# Patient Record
Sex: Female | Born: 1985 | Race: Black or African American | Hispanic: No | Marital: Single | Smoking: Never smoker
Health system: Southern US, Community
[De-identification: ages and names within clinical notes are randomized; demographics above are authoritative.]

## PROBLEM LIST (undated history)

## (undated) DIAGNOSIS — T4145XA Adverse effect of unspecified anesthetic, initial encounter: Secondary | ICD-10-CM

## (undated) DIAGNOSIS — T8859XA Other complications of anesthesia, initial encounter: Secondary | ICD-10-CM

## (undated) DIAGNOSIS — R011 Cardiac murmur, unspecified: Secondary | ICD-10-CM

## (undated) DIAGNOSIS — J40 Bronchitis, not specified as acute or chronic: Secondary | ICD-10-CM

## (undated) HISTORY — DX: Cardiac murmur, unspecified: R01.1

## (undated) HISTORY — DX: Bronchitis, not specified as acute or chronic: J40

---

## 2017-10-20 ENCOUNTER — Other Ambulatory Visit: Payer: Self-pay

## 2017-10-20 ENCOUNTER — Ambulatory Visit: Payer: BLUE CROSS/BLUE SHIELD | Admitting: Obstetrics and Gynecology

## 2017-10-20 ENCOUNTER — Other Ambulatory Visit (HOSPITAL_COMMUNITY)
Admission: RE | Admit: 2017-10-20 | Discharge: 2017-10-20 | Disposition: A | Discharge status: Home / Self Care | Payer: BLUE CROSS/BLUE SHIELD | Source: Ambulatory Visit | Attending: Obstetrics and Gynecology | Admitting: Obstetrics and Gynecology

## 2017-10-20 ENCOUNTER — Encounter: Payer: Self-pay | Admitting: Obstetrics and Gynecology

## 2017-10-20 VITALS — BP 158/80 | HR 84 | Resp 16 | Ht 59.75 in | Wt 273.0 lb

## 2017-10-20 DIAGNOSIS — Z23 Encounter for immunization: Secondary | ICD-10-CM

## 2017-10-20 DIAGNOSIS — Z6841 Body Mass Index (BMI) 40.0 and over, adult: Secondary | ICD-10-CM

## 2017-10-20 DIAGNOSIS — Z3009 Encounter for other general counseling and advice on contraception: Secondary | ICD-10-CM

## 2017-10-20 DIAGNOSIS — N913 Primary oligomenorrhea: Secondary | ICD-10-CM | POA: Diagnosis not present

## 2017-10-20 DIAGNOSIS — Z113 Encounter for screening for infections with a predominantly sexual mode of transmission: Secondary | ICD-10-CM | POA: Insufficient documentation

## 2017-10-20 DIAGNOSIS — Z124 Encounter for screening for malignant neoplasm of cervix: Secondary | ICD-10-CM | POA: Diagnosis not present

## 2017-10-20 DIAGNOSIS — R011 Cardiac murmur, unspecified: Secondary | ICD-10-CM | POA: Diagnosis not present

## 2017-10-20 DIAGNOSIS — Z7189 Other specified counseling: Secondary | ICD-10-CM

## 2017-10-20 DIAGNOSIS — Z7185 Encounter for immunization safety counseling: Secondary | ICD-10-CM

## 2017-10-20 DIAGNOSIS — Z01419 Encounter for gynecological examination (general) (routine) without abnormal findings: Secondary | ICD-10-CM

## 2017-10-20 DIAGNOSIS — Z0000 Encounter for general adult medical examination without abnormal findings: Secondary | ICD-10-CM

## 2017-10-20 DIAGNOSIS — R030 Elevated blood-pressure reading, without diagnosis of hypertension: Secondary | ICD-10-CM | POA: Diagnosis not present

## 2017-10-20 LAB — POCT URINE PREGNANCY: Preg Test, Ur: NEGATIVE

## 2017-10-20 NOTE — Patient Instructions (Addendum)
I would recommend a mediterranean diet.  A mediterranean diet is high in fruits, vegetables, whole grains, fish, chicken, nuts, healthy fats (olive oil or canola oil). Low fat dairy. Limit butter, margarine, red meat and sweets.    Breast Self-Awareness Breast self-awareness means being familiar with how your breasts look and feel. It involves checking your breasts regularly and reporting any changes to your health care provider. Practicing breast self-awareness is important. A change in your breasts can be a sign of a serious medical problem. Being familiar with how your breasts look and feel allows you to find any problems early, when treatment is more likely to be successful. All women should practice breast self-awareness, including women who have had breast implants. How to do a breast self-exam One way to learn what is normal for your breasts and whether your breasts are changing is to do a breast self-exam. To do a breast self-exam: Look for Changes  1. Remove all the clothing above your waist. 2. Stand in front of a mirror in a room with good lighting. 3. Put your hands on your hips. 4. Push your hands firmly downward. 5. Compare your breasts in the mirror. Look for differences between them (asymmetry), such as: ? Differences in shape. ? Differences in size. ? Puckers, dips, and bumps in one breast and not the other. 6. Look at each breast for changes in your skin, such as: ? Redness. ? Scaly areas. 7. Look for changes in your nipples, such as: ? Discharge. ? Bleeding. ? Dimpling. ? Redness. ? A change in position. Feel for Changes  Carefully feel your breasts for lumps and changes. It is best to do this while lying on your back on the floor and again while sitting or standing in the shower or tub with soapy water on your skin. Feel each breast in the following way:  Place the arm on the side of the breast you are examining above your head.  Feel your breast with the other  hand.  Start in the nipple area and make  inch (2 cm) overlapping circles to feel your breast. Use the pads of your three middle fingers to do this. Apply light pressure, then medium pressure, then firm pressure. The light pressure will allow you to feel the tissue closest to the skin. The medium pressure will allow you to feel the tissue that is a little deeper. The firm pressure will allow you to feel the tissue close to the ribs.  Continue the overlapping circles, moving downward over the breast until you feel your ribs below your breast.  Move one finger-width toward the center of the body. Continue to use the  inch (2 cm) overlapping circles to feel your breast as you move slowly up toward your collarbone.  Continue the up and down exam using all three pressures until you reach your armpit.  Write Down What You Find  Write down what is normal for each breast and any changes that you find. Keep a written record with breast changes or normal findings for each breast. By writing this information down, you do not need to depend only on memory for size, tenderness, or location. Write down where you are in your menstrual cycle, if you are still menstruating. If you are having trouble noticing differences in your breasts, do not get discouraged. With time you will become more familiar with the variations in your breasts and more comfortable with the exam. How often should I examine my breasts? Examine  your breasts every month. If you are breastfeeding, the best time to examine your breasts is after a feeding or after using a breast pump. If you menstruate, the best time to examine your breasts is 5-7 days after your period is over. During your period, your breasts are lumpier, and it may be more difficult to notice changes. When should I see my health care provider? See your health care provider if you notice:  A change in shape or size of your breasts or nipples.  A change in the skin of your  breast or nipples, such as a reddened or scaly area.  Unusual discharge from your nipples.  A lump or thick area that was not there before.  Pain in your breasts.  Anything that concerns you.  This information is not intended to replace advice given to you by your health care provider. Make sure you discuss any questions you have with your health care provider. Document Released: 07/20/2005 Document Revised: 12/26/2015 Document Reviewed: 06/09/2015 Elsevier Interactive Patient Education  2018 [employer].  EXERCISE AND DIET:  We recommended that you start or continue a regular exercise program for good health. Regular exercise means any activity that makes your heart beat faster and makes you sweat.  We recommend exercising at least 30 minutes per day at least 3 days a week, preferably 4 or 5.  We also recommend a diet low in fat and sugar.  Inactivity, poor dietary choices and obesity can cause diabetes, heart attack, stroke, and kidney damage, among others.    ALCOHOL AND SMOKING:  Women should limit their alcohol intake to no more than 7 drinks/beers/glasses of wine (combined, not each!) per week. Moderation of alcohol intake to this level decreases your risk of breast cancer and liver damage. And of course, no recreational drugs are part of a healthy lifestyle.  And absolutely no smoking or even second hand smoke. Most people know smoking can cause heart and lung diseases, but did you know it also contributes to weakening of your bones? Aging of your skin?  Yellowing of your teeth and nails?  CALCIUM AND VITAMIN D:  Adequate intake of calcium and Vitamin D are recommended.  The recommendations for exact amounts of these supplements seem to change often, but generally speaking 600 mg of calcium (either carbonate or citrate) and 800 units of Vitamin D per day seems prudent. Certain women may benefit from higher intake of Vitamin D.  If you are among these women, your doctor will have told you  during your visit.    PAP SMEARS:  Pap smears, to check for cervical cancer or precancers,  have traditionally been done yearly, although recent scientific advances have shown that most women can have pap smears less often.  However, every woman still should have a physical exam from her gynecologist every year. It will include a breast check, inspection of the vulva and vagina to check for abnormal growths or skin changes, a visual exam of the cervix, and then an exam to evaluate the size and shape of the uterus and ovaries.  And after 30 years of age, a rectal exam is indicated to check for rectal cancers. We will also provide age appropriate advice regarding health maintenance, like when you should have certain vaccines, screening for sexually transmitted diseases, bone density testing, colonoscopy, mammograms, etc.   MAMMOGRAMS:  All women over 14 years old should have a yearly mammogram. Many facilities now offer a "3D" mammogram, which may cost around $50  extra out of pocket. If possible,  we recommend you accept the option to have the 3D mammogram performed.  It both reduces the number of women who will be called back for extra views which then turn out to be normal, and it is better than the routine mammogram at detecting truly abnormal areas.    COLONOSCOPY:  Colonoscopy to screen for colon cancer is recommended for all women at age 42.  We know, you hate the idea of the prep.  We agree, BUT, having colon cancer and not knowing it is worse!!  Colon cancer so often starts as a polyp that can be seen and removed at colonscopy, which can quite literally save your life!  And if your first colonoscopy is normal and you have no family history of colon cancer, most women don't have to have it again for 10 years.  Once every ten years, you can do something that may end up saving your life, right?  We will be happy to help you get it scheduled when you are ready.  Be sure to check your insurance coverage so  you understand how much it will cost.  It may be covered as a preventative service at no cost, but you should check your particular policy.

## 2017-10-20 NOTE — Progress Notes (Signed)
32 y.o. G0P0000 SingleAfrican AmericanF here for annual exam.   Period Duration (Days): 5-7 days  Period Pattern: (!) Irregular Menstrual Flow: Heavy Menstrual Control: Maxi pad Menstrual Control Change Freq (Hours): changes pad every 2 hours  Dysmenorrhea: None  Menarche around age 30-13. Cycles have always been irregular. In the past she has gone up to 6 months without cycles. In the last year she has had a cycle every 1-3 months. She doesn't bleed for more than 5-7 days.  Sexually active, same partner x 9/18. Using condoms for contraception. No pain. Partner may have other partners.  She thinks she has gained weight.  In the past she had elevated BP on OCP's, was fine off.   Patient's last menstrual period was 10/11/2017.       LMP 10/11/17   Sexually active: Yes.    The current method of family planning is none.    Exercising: Yes.    stretching  Smoker:  no  Health Maintenance: Pap:  Never TDaP:  Unsure,  Gardasil: unsure    reports that  has never smoked. she has never used smokeless tobacco. She reports that she does not drink alcohol or use drugs. Occasional ETOH. 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   History reviewed. No pertinent past medical history.  History reviewed. No pertinent surgical history.  Current Outpatient Medications  Medication Sig Dispense Refill  . PROAIR HFA 108 (90 Base) MCG/ACT inhaler INL 2 PFS PO Q 4 H PRF WHZ  0   No current facility-administered medications for this visit.   Only on the proair for bronchitis.  History reviewed. No pertinent family history.  Review of Systems  Constitutional: Negative.   HENT: Negative.   Eyes: Negative.   Respiratory: Negative.   Cardiovascular: Negative.   Gastrointestinal: Negative.   Endocrine: Negative.   Genitourinary: Positive for menstrual problem.       Irregular menstrual periods   Musculoskeletal: Negative.   Skin: Negative.   Allergic/Immunologic: Negative.   Neurological: Negative.    Psychiatric/Behavioral: Negative.     Exam:   BP (!) 160/100 (BP Location: Right Arm, Patient Position: Sitting, Cuff Size: Large)   Pulse 84   Resp 16   Ht 4' 11.75" (1.518 m)   Wt 273 lb (123.8 kg)   LMP 10/11/2017   BMI 53.76 kg/m   Weight change: @WEIGHTCHANGE @ Height:   Height: 4' 11.75" (151.8 cm)  Ht Readings from Last 3 Encounters:  10/20/17 4' 11.75" (1.518 m)    General appearance: alert, cooperative and appears stated age Head: Normocephalic, without obvious abnormality, atraumatic Neck: no adenopathy, supple, symmetrical, trachea midline and thyroid normal to inspection and palpation Lungs: clear to auscultation bilaterally Cardiovascular: regular rate and rhythm, grade 2 SEM, loudest at the LSB Breasts: normal appearance, no masses or tenderness Abdomen: soft, non-tender; non distended,  no masses,  no organomegaly Extremities: extremities normal, atraumatic, no cyanosis or edema Skin: Skin color, texture, turgor normal. No rashes or lesions Lymph nodes: Cervical, supraclavicular, and axillary nodes normal. No abnormal inguinal nodes palpated Neurologic: Grossly normal   Pelvic: External genitalia:  no lesions              Urethra:  normal appearing urethra with no masses, tenderness or lesions              Bartholins and Skenes: normal                 Vagina: normal appearing vagina with normal color and  discharge, no lesions              Cervix: no lesions               Bimanual Exam:  Uterus:  anteverted, not appreciably enlarged              Adnexa: no mass, fullness, tenderness               Rectovaginal: Confirms               Anus:  normal sphincter tone, no lesions  Chaperone was present for exam.  A:  Well Woman exam  Elevated BP  BMI 53  Oligomenorrhea  Contraception  Heart murmur, will set up appointment with Cardiology   P:   UPT negative  We discussed weight loss, information given  Screening labs, HgbA1C, TSH  TDAP  Start  Gardsil  Pap with GC/CT  STD testing  Recommended continued use of condoms  Information on the mirena given, after her labs are back will further discuss  Stressed the importance of endometrial protection  Repeat BP still elevated, will recommend f/u with primary MD

## 2017-10-21 LAB — COMPREHENSIVE METABOLIC PANEL
A/G RATIO: 1.4 (ref 1.2–2.2)
ALBUMIN: 4.3 g/dL (ref 3.5–5.5)
ALK PHOS: 84 IU/L (ref 39–117)
ALT: 17 IU/L (ref 0–32)
AST: 15 IU/L (ref 0–40)
BUN / CREAT RATIO: 25 — AB (ref 9–23)
BUN: 17 mg/dL (ref 6–20)
CHLORIDE: 104 mmol/L (ref 96–106)
CO2: 22 mmol/L (ref 20–29)
Calcium: 9.5 mg/dL (ref 8.7–10.2)
Creatinine, Ser: 0.67 mg/dL (ref 0.57–1.00)
GFR calc Af Amer: 137 mL/min/{1.73_m2} (ref >59)
GFR calc non Af Amer: 119 mL/min/{1.73_m2} (ref >59)
GLOBULIN, TOTAL: 3 g/dL (ref 1.5–4.5)
GLUCOSE: 88 mg/dL (ref 65–99)
POTASSIUM: 4.2 mmol/L (ref 3.5–5.2)
SODIUM: 141 mmol/L (ref 134–144)
Total Protein: 7.3 g/dL (ref 6.0–8.5)

## 2017-10-21 LAB — HEP, RPR, HIV PANEL
HEP B S AG: NEGATIVE
HIV SCREEN 4TH GENERATION: NONREACTIVE
RPR: NONREACTIVE

## 2017-10-21 LAB — LIPID PANEL
CHOL/HDL RATIO: 3.3 ratio (ref 0.0–4.4)
Cholesterol, Total: 154 mg/dL (ref 100–199)
HDL: 46 mg/dL (ref >39)
LDL Calculated: 96 mg/dL (ref 0–99)
Triglycerides: 58 mg/dL (ref 0–149)
VLDL Cholesterol Cal: 12 mg/dL (ref 5–40)

## 2017-10-21 LAB — CBC
HEMOGLOBIN: 12.4 g/dL (ref 11.1–15.9)
Hematocrit: 37.9 % (ref 34.0–46.6)
MCH: 27.4 pg (ref 26.6–33.0)
MCHC: 32.7 g/dL (ref 31.5–35.7)
MCV: 84 fL (ref 79–97)
Platelets: 278 10*3/uL (ref 150–379)
RBC: 4.53 x10E6/uL (ref 3.77–5.28)
RDW: 13.7 % (ref 12.3–15.4)
WBC: 6.6 10*3/uL (ref 3.4–10.8)

## 2017-10-21 LAB — TSH: TSH: 1.19 u[IU]/mL (ref 0.450–4.500)

## 2017-10-21 LAB — HEPATITIS C ANTIBODY: Hep C Virus Ab: <0.1 s/co ratio (ref 0.0–0.9)

## 2017-10-21 LAB — CYTOLOGY - PAP
Chlamydia: NEGATIVE
Diagnosis: NEGATIVE
Neisseria Gonorrhea: NEGATIVE

## 2017-10-21 LAB — HEMOGLOBIN A1C
Est. average glucose Bld gHb Est-mCnc: 120 mg/dL
HEMOGLOBIN A1C: 5.8 % — AB (ref 4.8–5.6)

## 2017-11-09 ENCOUNTER — Encounter: Payer: Self-pay | Admitting: Cardiovascular Disease

## 2017-11-25 ENCOUNTER — Encounter: Payer: Self-pay | Admitting: Physician Assistant

## 2017-12-08 ENCOUNTER — Ambulatory Visit: Payer: BLUE CROSS/BLUE SHIELD | Admitting: Cardiovascular Disease

## 2017-12-14 ENCOUNTER — Ambulatory Visit: Payer: BLUE CROSS/BLUE SHIELD | Admitting: Physician Assistant

## 2017-12-14 VITALS — BP 138/86 | HR 75 | Ht 59.75 in | Wt 275.0 lb

## 2017-12-14 DIAGNOSIS — R7303 Prediabetes: Secondary | ICD-10-CM

## 2017-12-14 DIAGNOSIS — R011 Cardiac murmur, unspecified: Secondary | ICD-10-CM | POA: Diagnosis not present

## 2017-12-14 NOTE — Progress Notes (Signed)
Cardiology Office Note:    Date:  12/14/2017   ID:  Erin Pena, DOB [birth year], MRN [id number]  PCP:  Helane Rima, DO  Cardiologist:  Will Jorja Loa, MD   Referring MD: Romualdo Bolk, MD   Chief Complaint  Patient presents with  . Heart Murmur    History of Present Illness:    Erin Pena is a 32 y.o. female with a hx of prediabetes (A1c 5.8%) and irregular menstrual cycles. She presents today for cardiology evaluation for heart murmur. She was recently seen by her gynecologist who noted a soft systolic murmur on her exam on 10/20/17 and referred her to cardiology.   She presents today for new patient visit for heart murmur. She denies chest pain, shortness of breath, orthopnea, nocturnal apnea, lower extremity swelling, dizziness,and recent syncope. She gets bronchitis a few times each year with cough and was prescribed an inhaler to use PRN. She is not particularly worried about this murmur.  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 Nonsmoker, no illicit drugs. Single, no children. Grew up in [place].     Past Medical History:  Diagnosis Date  . Bronchitis   . Heart murmur     History reviewed. No pertinent surgical history.  Current Medications: Current Meds  Medication Sig  . Loratadine (CLARITIN PO) Take 1 tablet by mouth as needed.  . Multiple Vitamin (MULTIVITAMIN) capsule Take 1 capsule by mouth daily.  Marland Kitchen PROAIR HFA 108 (90 Base) MCG/ACT inhaler INL 2 PFS PO Q 4 H PRF WHZ     Allergies:   Patient has no known allergies.   Social History   Socioeconomic History  . Marital status: Single    Spouse name: Not on file  . Number of children: Not on file  . Years of education: Not on file  . Highest education level: Not on file  Occupational History  . Not on file  Social Needs  . Financial resource strain: Not on file  . Food insecurity:    Worry: Not on file    Inability: Not on file  . Transportation needs:    Medical: Not on file   Non-medical: Not on file  Tobacco Use  . Smoking status: Never Smoker  . Smokeless tobacco: Never Used  Substance and Sexual Activity  . Alcohol use: No    Frequency: Never  . Drug use: No  . Sexual activity: Yes    Partners: Male    Birth control/protection: None  Lifestyle  . Physical activity:    Days per week: Not on file    Minutes per session: Not on file  . Stress: Not on file  Relationships  . Social connections:    Talks on phone: Not on file    Gets together: Not on file    Attends religious service: Not on file    Active member of club or organization: Not on file    Attends meetings of clubs or organizations: Not on file    Relationship status: Not on file  Other Topics Concern  . Not on file  Social History Narrative  . Not on file     Family History: The patient's family history includes Hypertension in her father.  ROS:   Please see the history of present illness.    All other systems reviewed and are negative.  EKGs/Labs/Other Studies Reviewed:    The following studies were reviewed today:  none  EKG:  EKG is ordered today.  The  ekg ordered today demonstrates sinus rhythm with nonspecific ST changes, no prior for comparison.  Recent Labs: 10/20/2017: ALT 17; BUN 17; Creatinine, Ser 0.67; Hemoglobin 12.4; Platelets 278; Potassium 4.2; Sodium 141; TSH 1.190  Recent Lipid Panel    Component Value Date/Time   CHOL 154 10/20/2017 1607   TRIG 58 10/20/2017 1607   HDL 46 10/20/2017 1607   CHOLHDL 3.3 10/20/2017 1607   LDLCALC 96 10/20/2017 1607    Physical Exam:    VS:  BP 138/86   Pulse 75   Ht 4' 11.75" (1.518 m)   Wt 275 lb (124.7 kg)   SpO2 99%   BMI 54.16 kg/m     Wt Readings from Last 3 Encounters:  12/14/17 275 lb (124.7 kg)  10/20/17 273 lb (123.8 kg)     GEN: Well nourished, well developed in no acute distress HEENT: Normal NECK: No JVD; No carotid bruits LYMPHATICS: No lymphadenopathy CARDIAC: RRR, 1/6 systolic murmur  LSB RESPIRATORY:  Clear to auscultation without rales, wheezing or rhonchi  ABDOMEN: Soft, non-tender, non-distended MUSCULOSKELETAL:  No edema; No deformity  SKIN: Warm and dry NEUROLOGIC:  Alert and oriented x 3 PSYCHIATRIC:  Normal affect   ASSESSMENT:    1. Heart murmur   2. Prediabetes    PLAN:    In order of problems listed above:  Heart murmur  Will order echocardiogram with divinity and bubble study to evaluate structure and function. EKG WNL. She is asymptomatic. Case discussed with Dr. Elberta Fortis (DOD).   Prediabetes We discussed her pressure of 138/86 and a daily walking program in addition to avoiding salt and fast food. I encouraged walking 30-45 min daily to help with weight, A1c, and pressure.    Normal blood pressure If he pressure becomes greater than 140/90, we will need to consider an antihypertensive. Hopefully this will decrease or remain stable with daily walking and avoiding salt.    Medication Adjustments/Labs and Tests Ordered: Current medicines are reviewed at length with the patient today.  Concerns regarding medicines are outlined above.  Orders Placed This Encounter  Procedures  . EKG 12-Lead  . ECHOCARDIOGRAM LIMITED BUBBLE STUDY   No orders of the defined types were placed in this encounter.   Signed, Marcelino Duster, [place]  12/14/2017 9:57 AM    [facility]

## 2017-12-14 NOTE — Patient Instructions (Signed)
Medication Instructions:  1. Your physician recommends that you continue on your current medications as directed. Please refer to the Current Medication list given to you today.   Labwork: NONE ORDERED TODAY  Testing/Procedures: PLEASE SCHEDULE ECHOCARDIOGRAM (BUBBLE STUDY)  Follow-Up: FOLLOW UP AS NEEDED PENDING TEST RESULTS  Any Other Special Instructions Will Be Listed Below (If Applicable). RECOMMENDATIONS PER PA TRY TO INCORPORATE 30-45 MINUTES OF WALKING DAILY TO HELP YOUR A1C AND BLOOD PRESSURE       If you need a refill on your cardiac medications before your next appointment, please call your pharmacy.

## 2017-12-18 ENCOUNTER — Other Ambulatory Visit: Payer: Self-pay

## 2017-12-18 ENCOUNTER — Emergency Department (HOSPITAL_COMMUNITY): Payer: BLUE CROSS/BLUE SHIELD

## 2017-12-18 ENCOUNTER — Encounter (HOSPITAL_COMMUNITY): Payer: Self-pay | Admitting: Emergency Medicine

## 2017-12-18 ENCOUNTER — Emergency Department (HOSPITAL_COMMUNITY)
Admission: EM | Admit: 2017-12-18 | Discharge: 2017-12-18 | Disposition: A | Discharge status: Home / Self Care | Payer: BLUE CROSS/BLUE SHIELD | Attending: Emergency Medicine | Admitting: Emergency Medicine

## 2017-12-18 DIAGNOSIS — R1011 Right upper quadrant pain: Secondary | ICD-10-CM

## 2017-12-18 DIAGNOSIS — Z79899 Other long term (current) drug therapy: Secondary | ICD-10-CM | POA: Diagnosis not present

## 2017-12-18 DIAGNOSIS — K8050 Calculus of bile duct without cholangitis or cholecystitis without obstruction: Secondary | ICD-10-CM

## 2017-12-18 DIAGNOSIS — K8020 Calculus of gallbladder without cholecystitis without obstruction: Secondary | ICD-10-CM | POA: Insufficient documentation

## 2017-12-18 LAB — URINALYSIS, ROUTINE W REFLEX MICROSCOPIC
BILIRUBIN URINE: NEGATIVE
Glucose, UA: NEGATIVE mg/dL
HGB URINE DIPSTICK: NEGATIVE
KETONES UR: NEGATIVE mg/dL
Leukocytes, UA: NEGATIVE
NITRITE: NEGATIVE
PH: 6 (ref 5.0–8.0)
Protein, ur: NEGATIVE mg/dL
SPECIFIC GRAVITY, URINE: 1.026 (ref 1.005–1.030)

## 2017-12-18 LAB — LIPASE, BLOOD: LIPASE: 29 U/L (ref 11–51)

## 2017-12-18 LAB — CBC
HEMATOCRIT: 45.2 % (ref 36.0–46.0)
Hemoglobin: 14.1 g/dL (ref 12.0–15.0)
MCH: 27.4 pg (ref 26.0–34.0)
MCHC: 31.2 g/dL (ref 30.0–36.0)
MCV: 87.9 fL (ref 78.0–100.0)
PLATELETS: 251 10*3/uL (ref 150–400)
RBC: 5.14 MIL/uL — AB (ref 3.87–5.11)
RDW: 13.6 % (ref 11.5–15.5)
WBC: 8 10*3/uL (ref 4.0–10.5)

## 2017-12-18 LAB — COMPREHENSIVE METABOLIC PANEL
ALT: 22 U/L (ref 14–54)
AST: 18 U/L (ref 15–41)
Albumin: 3.7 g/dL (ref 3.5–5.0)
Alkaline Phosphatase: 80 U/L (ref 38–126)
Anion gap: 10 (ref 5–15)
BILIRUBIN TOTAL: 0.5 mg/dL (ref 0.3–1.2)
BUN: 19 mg/dL (ref 6–20)
CO2: 26 mmol/L (ref 22–32)
CREATININE: 0.89 mg/dL (ref 0.44–1.00)
Calcium: 9.3 mg/dL (ref 8.9–10.3)
Chloride: 105 mmol/L (ref 101–111)
Glucose, Bld: 109 mg/dL — ABNORMAL HIGH (ref 65–99)
Potassium: 3.7 mmol/L (ref 3.5–5.1)
Sodium: 141 mmol/L (ref 135–145)
TOTAL PROTEIN: 7.8 g/dL (ref 6.5–8.1)

## 2017-12-18 LAB — I-STAT BETA HCG BLOOD, ED (MC, WL, AP ONLY): I-stat hCG, quantitative: <5 m[IU]/mL (ref <5)

## 2017-12-18 MED ORDER — ONDANSETRON HCL 4 MG/2ML IJ SOLN
4.0000 mg | Freq: Once | INTRAMUSCULAR | Status: AC | PRN
  Administered 2017-12-18: 4 mg via INTRAVENOUS
  Filled 2017-12-18: qty 2

## 2017-12-18 MED ORDER — FENTANYL CITRATE (PF) 100 MCG/2ML IJ SOLN
50.0000 ug | INTRAMUSCULAR | Status: DC | PRN
  Administered 2017-12-18: 50 ug via INTRAVENOUS
  Filled 2017-12-18: qty 2

## 2017-12-18 MED ORDER — HYDROCODONE-ACETAMINOPHEN 5-325 MG PO TABS: 2.0000 | ORAL_TABLET | ORAL | 0 refills | Status: DC | PRN

## 2017-12-18 NOTE — ED Provider Notes (Signed)
[facility] EMERGENCY DEPARTMENT Provider Note   CSN: [id number] Arrival date & time: 12/18/17  9562     History   Chief Complaint Chief Complaint  Patient presents with  . Abdominal Pain    HPI Erin Pena is a 32 y.o. female.  Patient with obesity history presents with gradually worsening intermittent abdominal pain since yesterday. Patient has been eating fatty food recently. No history of gallstones known. Patient has had a few episodes similar in the past.  Pain has improved since waiting in the ER.patient was able to tolerate she does while waiting.     Past Medical History:  Diagnosis Date  . Bronchitis   . Heart murmur     There are no active problems to display for this patient.   History reviewed. No pertinent surgical history.   OB History    Gravida  0   Para  0   Term  0   Preterm  0   AB  0   Living  0     SAB  0   TAB  0   Ectopic  0   Multiple  0   Live Births  0            Home Medications    Prior to Admission medications   Medication Sig Start Date End Date Taking? Authorizing Provider  Loratadine (CLARITIN PO) Take 1 tablet by mouth as needed.   Yes [provider]  Multiple Vitamin (MULTIVITAMIN) capsule Take 1 capsule by mouth daily.   Yes [provider]  PROAIR HFA 108 (90 Base) MCG/ACT inhaler INL 2 PFS PO Q 4 H PRF WHZ 08/16/17  Yes [provider]    Family History Family History  Problem Relation Age of Onset  . Hypertension Father     Social History Social History   Tobacco Use  . Smoking status: Never Smoker  . Smokeless tobacco: Never Used  Substance Use Topics  . Alcohol use: No    Frequency: Never  . Drug use: No     Allergies   Patient has no known allergies.   Review of Systems Review of Systems  Constitutional: Negative for chills and fever.  HENT: Negative for congestion.   Eyes: Negative for visual disturbance.  Respiratory:  Negative for shortness of breath.   Cardiovascular: Negative for chest pain.  Gastrointestinal: Positive for abdominal pain and nausea. Negative for vomiting.  Genitourinary: Negative for dysuria and flank pain.  Musculoskeletal: Negative for back pain, neck pain and neck stiffness.  Skin: Negative for rash.  Neurological: Negative for light-headedness and headaches.     Physical Exam Updated Vital Signs BP (!) 126/99   Pulse 60   Temp 98.7 F (37.1 C) (Oral)   Resp 16   Ht 5' (1.524 m)   Wt 123.8 kg (273 lb)   SpO2 100%   BMI 53.32 kg/m   Physical Exam  Constitutional: She is oriented to person, place, and time. She appears well-developed and well-nourished.  HENT:  Head: Normocephalic and atraumatic.  Eyes: Conjunctivae are normal. Right eye exhibits no discharge. Left eye exhibits no discharge.  Neck: Normal range of motion. Neck supple. No tracheal deviation present.  Cardiovascular: Normal rate and regular rhythm.  Pulmonary/Chest: Effort normal and breath sounds normal.  Abdominal: Soft. She exhibits no distension. There is tenderness (minimal right upper quadrant). There is no guarding.  Musculoskeletal: She exhibits no edema.  Neurological: She is alert and oriented  to person, place, and time.  Skin: Skin is warm. No rash noted.  Psychiatric: She has a normal mood and affect.  Nursing note and vitals reviewed.    ED Treatments / Results  Labs (all labs ordered are listed, but only abnormal results are displayed) Labs Reviewed  CBC - Abnormal; Notable for the following components:      Result Value   RBC 5.14 (*)    All other components within normal limits  URINALYSIS, ROUTINE W REFLEX MICROSCOPIC - Abnormal; Notable for the following components:   APPearance HAZY (*)    Bacteria, UA RARE (*)    All other components within normal limits  COMPREHENSIVE METABOLIC PANEL - Abnormal; Notable for the following components:   Glucose, Bld 109 (*)    All other  components within normal limits  LIPASE, BLOOD  I-STAT BETA HCG BLOOD, ED ([facility], [facility], [facility] ONLY)    EKG None  Radiology US Abdomen Limited Ruq  Result Date: 12/18/2017 CLINICAL DATA:  32 year old female with acute RIGHT UPPER quadrant abdominal pain today. EXAM: ULTRASOUND ABDOMEN LIMITED RIGHT UPPER QUADRANT COMPARISON:  None. FINDINGS: Gallbladder: A 2.1 cm gallstone is present. There is gallbladder wall thickening identified. No pericholecystic fluid or definite sonographic Murphy sign. Common bile duct: Diameter: 4 mm.  No intrahepatic or extrahepatic biliary dilatation. Liver: Slightly increased hepatic echogenicity may represent hepatic steatosis. No other hepatic abnormalities are identified. Portal vein is patent on color Doppler imaging with normal direction of blood flow towards the liver. IMPRESSION: 1. Cholelithiasis with gallbladder wall thickening likely representing acute cholecystitis. No biliary dilatation. 2. Question mild hepatic steatosis. Electronically Signed   By: Harmon Pier M.D.   On: 12/18/2017 13:50    Procedures Procedures (including critical care time)  Medications Ordered in ED Medications  fentaNYL (SUBLIMAZE) injection 50 mcg (50 mcg Intravenous Given 12/18/17 0706)  ondansetron (ZOFRAN) injection 4 mg (4 mg Intravenous Given 12/18/17 0706)     Initial Impression / Assessment and Plan / ED Course  I have reviewed the triage vital signs and the nursing notes.  Pertinent labs & imaging results that were available during my care of the patient were reviewed by me and considered in my medical decision making (see chart for details).    Well appearing patient presents with clinically: The cholelithiasis. Patient had formal ultrasound done in the waiting room with possible early cholecystitis based on wall thickening. Patient has no white blood cell count, no fever, currently minimal pain and no vomiting. Discussed the case with Dr. Janee Morn who will see the patient  or one of his partners on Monday to arrange surgery. Reasons to return given. LFTs normal. UA unremarkable.  [place] reviewed.  Results and differential diagnosis were discussed with the patient/parent/guardian. Xrays were independently reviewed by myself.  Close follow up outpatient was discussed, comfortable with the plan.   Medications  fentaNYL (SUBLIMAZE) injection 50 mcg (50 mcg Intravenous Given 12/18/17 0706)  ondansetron (ZOFRAN) injection 4 mg (4 mg Intravenous Given 12/18/17 0706)    Vitals:   12/18/17 0640 12/18/17 1048 12/18/17 1254 12/18/17 1600  BP: (!) 174/132 126/83 (!) 161/92 (!) 126/99  Pulse: 92 68 (!) 58 60  Resp: Temp: 98.3 F (36.8 C) 98.7 F (37.1 C)    TempSrc: Oral Oral    SpO2: 100% 100% 100% 100%  Weight: 123.8 kg (273 lb)     Height: 5' (1.524 m)       Final diagnoses:  RUQ  pain  Biliary colic    Final Clinical Impressions(s) / ED Diagnoses   Final diagnoses:  RUQ pain  Biliary colic    ED Discharge Orders    None       Blane Ohara, MD 12/18/17 1616

## 2017-12-18 NOTE — Discharge Instructions (Addendum)
Avoid any fatty foods and follow up with general surgeon on Monday or Tuesday to arrange surgery. If you develops uncontrolled pain, persistent vomiting, fevers or worsening symptoms come back to the emergency room over the weekend. For mild pain take tylenol or motrin. For severe pain take norco or vicodin however realize they have the potential for addiction and it can make you sleepy and has tylenol in it.  No operating machinery while taking.

## 2017-12-18 NOTE — ED Triage Notes (Signed)
Reports sudden onset of RUQ pain with nausea and vomiting.  Reports having the same pain before but never had it checked out.

## 2017-12-18 NOTE — ED Notes (Signed)
Results reviewed.  No changes in acuity at this time 

## 2017-12-18 NOTE — ED Notes (Signed)
Ultrasound results reviewed.  Due to probably cholelithiasis, will prioritize rooming and upgrade acuity

## 2017-12-20 ENCOUNTER — Other Ambulatory Visit: Payer: Self-pay

## 2017-12-20 ENCOUNTER — Ambulatory Visit (HOSPITAL_COMMUNITY): Payer: BLUE CROSS/BLUE SHIELD | Attending: Cardiology

## 2017-12-20 DIAGNOSIS — R011 Cardiac murmur, unspecified: Secondary | ICD-10-CM

## 2018-01-07 ENCOUNTER — Ambulatory Visit: Payer: BLUE CROSS/BLUE SHIELD | Admitting: Family Medicine

## 2018-01-07 ENCOUNTER — Encounter: Payer: Self-pay | Admitting: Family Medicine

## 2018-01-07 VITALS — BP 118/86 | HR 74 | Temp 98.2°F | Ht 59.75 in | Wt 267.8 lb

## 2018-01-07 DIAGNOSIS — N926 Irregular menstruation, unspecified: Secondary | ICD-10-CM | POA: Diagnosis not present

## 2018-01-07 DIAGNOSIS — R010 Benign and innocent cardiac murmurs: Secondary | ICD-10-CM

## 2018-01-07 DIAGNOSIS — E8881 Metabolic syndrome: Secondary | ICD-10-CM | POA: Diagnosis not present

## 2018-01-07 DIAGNOSIS — S65311A Laceration of deep palmar arch of right hand, initial encounter: Secondary | ICD-10-CM | POA: Diagnosis not present

## 2018-01-07 DIAGNOSIS — E88819 Insulin resistance, unspecified: Secondary | ICD-10-CM | POA: Insufficient documentation

## 2018-01-07 DIAGNOSIS — K760 Fatty (change of) liver, not elsewhere classified: Secondary | ICD-10-CM | POA: Diagnosis not present

## 2018-01-07 DIAGNOSIS — K8010 Calculus of gallbladder with chronic cholecystitis without obstruction: Secondary | ICD-10-CM | POA: Insufficient documentation

## 2018-01-07 NOTE — Progress Notes (Signed)
Dorthie Santini is a 32 y.o. female is here TO ESTABLISH CARE.  History of Present Illness:   Britt Bottom CMA acting as scribe for Dr. Earlene Plater.  HPI: Patient comes in today to establish care with Dr. Earlene Plater.   Gallstone: Patient was seen in the ED in may for abdominal pain. Ultra sound was performed that showed a gallstone. Surgery referral has been placed.   Cardiology: Patient was seen in May 2019 by Cardiology for a heart murmur. Cardiology order a Echocardiogram.   There are no preventive care reminders to display for this patient. Depression screen PHQ 2/9 01/07/2018  Decreased Interest 0  Down, Depressed, Hopeless 0  PHQ - 2 Score 0   PMHx, SurgHx, SocialHx, FamHx, Medications, and Allergies were reviewed in the Visit Navigator and updated as appropriate.   Patient Active Problem List   Diagnosis Date Noted  . Irregular menses 01/07/2018  . NAFL (nonalcoholic fatty liver) 01/07/2018  . Insulin resistance 01/07/2018  . Morbid obesity (HCC) 01/07/2018  . Calculus of gallbladder with chronic cholecystitis without obstruction 01/07/2018  . Benign cardiac murmur 01/07/2018   Social History   Tobacco Use  . Smoking status: Never Smoker  . Smokeless tobacco: Never Used  Substance Use Topics  . Alcohol use: No    Frequency: Never  . Drug use: No   Current Medications and Allergies:   .  Loratadine (CLARITIN PO), Take 1 tablet by mouth as needed., Disp: , Rfl:  .  Multiple Vitamin (MULTIVITAMIN) capsule, Take 1 capsule by mouth daily., Disp: , Rfl:  .  PROAIR HFA 108 (90 Base) MCG/ACT inhaler, INL 2 PFS PO Q 4 H PRF WHZ, Disp: , Rfl: 0  No Known Allergies   Review of Systems   Pertinent items are noted in the HPI. Otherwise, ROS is negative.  Vitals:   Vitals:   01/07/18 0803  BP: 118/86  Pulse: 74  Temp: 98.2 F (36.8 C)  TempSrc: Oral  SpO2: 96%  Weight: 267 lb 12.8 oz (121.5 kg)  Height: 4' 11.75" (1.518 m)     Body mass index is 52.74  kg/m.  Physical Exam:   Physical Exam  Constitutional: She is oriented to person, place, and time. She appears well-developed and well-nourished. No distress.  HENT:  Head: Normocephalic and atraumatic.  Right Ear: External ear normal.  Left Ear: External ear normal.  Nose: Nose normal.  Mouth/Throat: Oropharynx is clear and moist.  Eyes: Pupils are equal, round, and reactive to light. Conjunctivae and EOM are normal.  Neck: Normal range of motion. Neck supple. No thyromegaly present.  Cardiovascular: Normal rate, regular rhythm and intact distal pulses.  Murmur heard.  Systolic murmur is present with a grade of 2/6. Pulmonary/Chest: Effort normal and breath sounds normal.  Abdominal: Soft. Bowel sounds are normal.  Musculoskeletal: Normal range of motion.  Lymphadenopathy:    She has no cervical adenopathy.  Neurological: She is alert and oriented to person, place, and time.  Skin: Skin is warm and dry. Capillary refill takes less than 2 seconds.  Laceration right hand at 4th-5th digit web space. No sign of infection.  Psychiatric: She has a normal mood and affect. Her behavior is normal.  Nursing note and vitals reviewed.   Assessment and Plan:   Mira was seen today for annual exam.  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 Reviewed red flags. Orders: -  Ambulatory referral to General Surgery  Morbid obesity [facility](HCC) Comments: Okay to assist weight loss after surgery recovery - will likely use Metformin + Phenterine, along with nutrition education.   Laceration of deep palmar arch of right hand, initial encounter Comments: Dermabond today.   NAFL (nonalcoholic fatty liver) Comments: On US. Will work on insulin resistance.   Insulin resistance Comments: Metformin and nutrition education after surgery.  Irregular menses Comments: Followed  by GYN now. Questionable PCOS picture.   Benign cardiac murmur Comments: Reviewed ECHO by Cardiology. No concerns.    . Reviewed expectations re: course of current medical issues. . Discussed self-management of symptoms. . Outlined signs and symptoms indicating need for more acute intervention. . Patient verbalized understanding and all questions were answered. Marland Kitchen. Health Maintenance issues including appropriate healthy diet, exercise, and smoking avoidance were discussed with patient. . See orders for this visit as documented in the electronic medical record. . Patient received an After Visit Summary.  Helane RimaErica Ruwayda Curet, DO [place] [place] 01/07/2018  Future Appointments  Date Time Provider Department Center  11/10/2018  3:30 PM Romualdo BolkJertson, Jill Evelyn, MD [facility] None   CMA served as scribe during this visit. History, Physical, and Plan performed by medical provider. The above documentation has been reviewed and is accurate and complete. Helane RimaErica Kennedey Digilio, D.O.

## 2018-01-07 NOTE — Patient Instructions (Signed)
Follow up after you have recovered from gallbladder surgery.

## 2018-01-20 ENCOUNTER — Ambulatory Visit: Payer: Self-pay | Admitting: General Surgery

## 2018-02-07 NOTE — Pre-Procedure Instructions (Signed)
Erin ArnoldJasmine S Pena  02/07/2018      [facility] [phone] [place] - [address] [facility] [address] [facility] [address] [place] [id number] Phone: [phone] Fax: [phone]    Your procedure is scheduled on Thursday July 18.  [facility].M.  Call this number if you have problems the morning of surgery:  [phone]   Remember:  Do not eat or drink after midnight.  **DRINK Ensure pre-surgery drink 3 hours prior to surgery (by 4:30AM)**    Take these medicines the morning of surgery with A SIP OF WATER: Loratadine (Claritin) if needed, PROAIR if needed  7 days prior to surgery STOP taking any Aspirin(unless otherwise instructed by your surgeon), Aleve, Naproxen, Ibuprofen, Motrin, Advil, Goody's, BC's, all herbal medications, fish oil, and all vitamins     Do not wear jewelry, make-up or nail polish.  Do not wear lotions, powders, or perfumes, or deodorant.  Do not shave 48 hours prior to surgery.  Men may shave face and neck.  Do not bring valuables to the hospital.  [facility] is not responsible for any belongings or valuables.  Contacts, dentures or bridgework may not be worn into surgery.  Leave your suitcase in the car.  After surgery it may be brought to your room.  For patients admitted to the hospital, discharge time will be determined by your treatment team.  Patients discharged the day of surgery will not be allowed to drive home.   Special instructions:    [facility]- Preparing For Surgery  Before surgery, you can play an important role. Because skin is not sterile, your skin needs to be as free of germs as possible. You can reduce the number of germs on your skin by washing with CHG (chlorahexidine gluconate) Soap before surgery.  CHG is an antiseptic cleaner which kills germs and bonds with the skin to continue killing germs even after washing.    Oral Hygiene is also important to reduce  your risk of infection.  Remember - BRUSH YOUR TEETH THE MORNING OF SURGERY WITH YOUR REGULAR TOOTHPASTE  Please do not use if you have an allergy to CHG or antibacterial soaps. If your skin becomes reddened/irritated stop using the CHG.  Do not shave (including legs and underarms) for at least 48 hours prior to first CHG shower. It is OK to shave your face.  Please follow these instructions carefully.   1. Shower the NIGHT BEFORE SURGERY and the MORNING OF SURGERY with CHG.   2. If you chose to wash your hair, wash your hair first as usual with your normal shampoo.  3. After you shampoo, rinse your hair and body thoroughly to remove the shampoo.  4. Use CHG as you would any other liquid soap. You can apply CHG directly to the skin and wash gently with a scrungie or a clean washcloth.   5. Apply the CHG Soap to your body ONLY FROM THE NECK DOWN.  Do not use on open wounds or open sores. Avoid contact with your eyes, ears, mouth and genitals (private parts). Wash Face and genitals (private parts)  with your normal soap.  6. Wash thoroughly, paying special attention to the area where your surgery will be performed.  7. Thoroughly rinse your body with warm water from the neck down.  8. DO NOT shower/wash with your normal soap after using and rinsing off the  CHG Soap.  9. Pat yourself dry with a CLEAN TOWEL.  10. Wear CLEAN PAJAMAS to bed the night before surgery, wear comfortable clothes the morning of surgery  11. Place CLEAN SHEETS on your bed the night of your first shower and DO NOT SLEEP WITH PETS.    Day of Surgery:  Do not apply any deodorants/lotions.  Please wear clean clothes to the hospital/surgery center.   Remember to brush your teeth WITH YOUR REGULAR TOOTHPASTE.    Please read over the following fact sheets that you were given. Coughing and Deep Breathing and Surgical Site Infection Prevention

## 2018-02-09 ENCOUNTER — Other Ambulatory Visit: Payer: Self-pay

## 2018-02-09 ENCOUNTER — Encounter (HOSPITAL_COMMUNITY): Payer: Self-pay

## 2018-02-09 ENCOUNTER — Encounter (HOSPITAL_COMMUNITY)
Admission: RE | Admit: 2018-02-09 | Discharge: 2018-02-09 | Disposition: A | Discharge status: Home / Self Care | Payer: BLUE CROSS/BLUE SHIELD | Source: Ambulatory Visit | Attending: General Surgery | Admitting: General Surgery

## 2018-02-09 DIAGNOSIS — Z01812 Encounter for preprocedural laboratory examination: Secondary | ICD-10-CM | POA: Insufficient documentation

## 2018-02-09 HISTORY — DX: Adverse effect of unspecified anesthetic, initial encounter: T41.45XA

## 2018-02-09 HISTORY — DX: Other complications of anesthesia, initial encounter: T88.59XA

## 2018-02-09 LAB — CBC
HCT: 42.5 % (ref 36.0–46.0)
HEMOGLOBIN: 13 g/dL (ref 12.0–15.0)
MCH: 27.2 pg (ref 26.0–34.0)
MCHC: 30.6 g/dL (ref 30.0–36.0)
MCV: 88.9 fL (ref 78.0–100.0)
Platelets: 229 10*3/uL (ref 150–400)
RBC: 4.78 MIL/uL (ref 3.87–5.11)
RDW: 12.6 % (ref 11.5–15.5)
WBC: 6.6 10*3/uL (ref 4.0–10.5)

## 2018-02-09 NOTE — Progress Notes (Signed)
PCP - Dr Helane RimaErica Wallace Cardiologist - [place]  EKG - 12/14/17 ECHO - 12/20/17 Patient denies shortness of breath, fever, cough and chest pain at PAT appointment   Patient verbalized understanding of instructions that were given to them at the PAT appointment. Patient was also instructed that they will need to review over the PAT instructions again at home before surgery.

## 2018-02-16 MED ORDER — CEFAZOLIN SODIUM 10 G IJ SOLR
3.0000 g | INTRAMUSCULAR | Status: AC
  Administered 2018-02-17: 3 g via INTRAVENOUS
  Filled 2018-02-16: qty 3

## 2018-02-17 ENCOUNTER — Ambulatory Visit (HOSPITAL_COMMUNITY): Payer: BLUE CROSS/BLUE SHIELD | Admitting: Certified Registered Nurse Anesthetist

## 2018-02-17 ENCOUNTER — Ambulatory Visit (HOSPITAL_COMMUNITY)
Admission: RE | Admit: 2018-02-17 | Discharge: 2018-02-17 | Disposition: A | Discharge status: Home / Self Care | Payer: BLUE CROSS/BLUE SHIELD | Source: Ambulatory Visit | Attending: General Surgery | Admitting: General Surgery

## 2018-02-17 ENCOUNTER — Encounter (HOSPITAL_COMMUNITY)
Admission: RE | Disposition: A | Discharge status: Home / Self Care | Payer: Self-pay | Source: Ambulatory Visit | Attending: General Surgery

## 2018-02-17 ENCOUNTER — Encounter (HOSPITAL_COMMUNITY): Payer: Self-pay | Admitting: *Deleted

## 2018-02-17 DIAGNOSIS — Z6841 Body Mass Index (BMI) 40.0 and over, adult: Secondary | ICD-10-CM | POA: Diagnosis not present

## 2018-02-17 DIAGNOSIS — K8020 Calculus of gallbladder without cholecystitis without obstruction: Secondary | ICD-10-CM | POA: Diagnosis present

## 2018-02-17 DIAGNOSIS — K8010 Calculus of gallbladder with chronic cholecystitis without obstruction: Secondary | ICD-10-CM | POA: Diagnosis not present

## 2018-02-17 HISTORY — PX: CHOLECYSTECTOMY: SHX55

## 2018-02-17 LAB — POCT PREGNANCY, URINE: Preg Test, Ur: NEGATIVE

## 2018-02-17 SURGERY — LAPAROSCOPIC CHOLECYSTECTOMY WITH INTRAOPERATIVE CHOLANGIOGRAM
Anesthesia: General | Site: Abdomen

## 2018-02-17 MED ORDER — DEXAMETHASONE SODIUM PHOSPHATE 10 MG/ML IJ SOLN
INTRAMUSCULAR | Status: AC
  Filled 2018-02-17: qty 1

## 2018-02-17 MED ORDER — OXYCODONE HCL 5 MG/5ML PO SOLN: 5.0000 mg | Freq: Once | ORAL | Status: DC | PRN

## 2018-02-17 MED ORDER — LIDOCAINE 2% (20 MG/ML) 5 ML SYRINGE
INTRAMUSCULAR | Status: AC
  Filled 2018-02-17: qty 5

## 2018-02-17 MED ORDER — FENTANYL CITRATE (PF) 250 MCG/5ML IJ SOLN
INTRAMUSCULAR | Status: DC | PRN
  Administered 2018-02-17: 50 ug via INTRAVENOUS
  Administered 2018-02-17: 100 ug via INTRAVENOUS
  Administered 2018-02-17 (×2): 50 ug via INTRAVENOUS

## 2018-02-17 MED ORDER — CHLORHEXIDINE GLUCONATE CLOTH 2 % EX PADS: 6.0000 | MEDICATED_PAD | Freq: Once | CUTANEOUS | Status: DC

## 2018-02-17 MED ORDER — MIDAZOLAM HCL 5 MG/5ML IJ SOLN
INTRAMUSCULAR | Status: DC | PRN
  Administered 2018-02-17: 2 mg via INTRAVENOUS

## 2018-02-17 MED ORDER — MIDAZOLAM HCL 2 MG/2ML IJ SOLN
INTRAMUSCULAR | Status: AC
  Filled 2018-02-17: qty 2

## 2018-02-17 MED ORDER — PROPOFOL 10 MG/ML IV BOLUS
INTRAVENOUS | Status: AC
  Filled 2018-02-17: qty 20

## 2018-02-17 MED ORDER — CELECOXIB 200 MG PO CAPS
200.0000 mg | ORAL_CAPSULE | ORAL | Status: AC
  Administered 2018-02-17: 200 mg via ORAL
  Filled 2018-02-17: qty 1

## 2018-02-17 MED ORDER — ROCURONIUM BROMIDE 10 MG/ML (PF) SYRINGE
PREFILLED_SYRINGE | INTRAVENOUS | Status: DC | PRN
  Administered 2018-02-17: 80 mg via INTRAVENOUS

## 2018-02-17 MED ORDER — ESMOLOL HCL 100 MG/10ML IV SOLN
INTRAVENOUS | Status: AC
  Filled 2018-02-17: qty 10

## 2018-02-17 MED ORDER — ONDANSETRON HCL 4 MG/2ML IJ SOLN
INTRAMUSCULAR | Status: AC
  Filled 2018-02-17: qty 2

## 2018-02-17 MED ORDER — GABAPENTIN 300 MG PO CAPS
300.0000 mg | ORAL_CAPSULE | ORAL | Status: AC
  Administered 2018-02-17: 300 mg via ORAL
  Filled 2018-02-17: qty 1

## 2018-02-17 MED ORDER — ROCURONIUM BROMIDE 10 MG/ML (PF) SYRINGE
PREFILLED_SYRINGE | INTRAVENOUS | Status: AC
  Filled 2018-02-17: qty 10

## 2018-02-17 MED ORDER — ESMOLOL HCL 100 MG/10ML IV SOLN
INTRAVENOUS | Status: DC | PRN
  Administered 2018-02-17: 30 mg via INTRAVENOUS

## 2018-02-17 MED ORDER — SODIUM CHLORIDE 0.9 % IR SOLN
Status: DC | PRN
  Administered 2018-02-17: 1000 mL

## 2018-02-17 MED ORDER — FENTANYL CITRATE (PF) 100 MCG/2ML IJ SOLN
INTRAMUSCULAR | Status: AC
  Filled 2018-02-17: qty 2

## 2018-02-17 MED ORDER — FENTANYL CITRATE (PF) 250 MCG/5ML IJ SOLN
INTRAMUSCULAR | Status: AC
  Filled 2018-02-17: qty 5

## 2018-02-17 MED ORDER — ACETAMINOPHEN 500 MG PO TABS
1000.0000 mg | ORAL_TABLET | ORAL | Status: AC
  Administered 2018-02-17: 1000 mg via ORAL
  Filled 2018-02-17: qty 2

## 2018-02-17 MED ORDER — SUGAMMADEX SODIUM 200 MG/2ML IV SOLN
INTRAVENOUS | Status: DC | PRN
  Administered 2018-02-17: 500 mg via INTRAVENOUS

## 2018-02-17 MED ORDER — DEXAMETHASONE SODIUM PHOSPHATE 10 MG/ML IJ SOLN
INTRAMUSCULAR | Status: DC | PRN
Start: 2018-02-17 — End: 2018-02-17
  Administered 2018-02-17: 10 mg via INTRAVENOUS

## 2018-02-17 MED ORDER — PROPOFOL 10 MG/ML IV BOLUS
INTRAVENOUS | Status: DC | PRN
  Administered 2018-02-17: 140 mg via INTRAVENOUS
  Administered 2018-02-17 (×2): 30 mg via INTRAVENOUS

## 2018-02-17 MED ORDER — ALBUTEROL SULFATE HFA 108 (90 BASE) MCG/ACT IN AERS
INHALATION_SPRAY | RESPIRATORY_TRACT | Status: DC | PRN
  Administered 2018-02-17: 4 via RESPIRATORY_TRACT

## 2018-02-17 MED ORDER — ONDANSETRON HCL 4 MG/2ML IJ SOLN
INTRAMUSCULAR | Status: DC | PRN
  Administered 2018-02-17: 4 mg via INTRAVENOUS

## 2018-02-17 MED ORDER — BUPIVACAINE-EPINEPHRINE (PF) 0.25% -1:200000 IJ SOLN
INTRAMUSCULAR | Status: AC
Start: 2018-02-17 — End: ?
  Filled 2018-02-17: qty 30

## 2018-02-17 MED ORDER — OXYCODONE HCL 5 MG PO TABS: 5.0000 mg | ORAL_TABLET | Freq: Four times a day (QID) | ORAL | 0 refills | Status: AC | PRN

## 2018-02-17 MED ORDER — LIDOCAINE 2% (20 MG/ML) 5 ML SYRINGE
INTRAMUSCULAR | Status: DC | PRN
  Administered 2018-02-17: 60 mg via INTRAVENOUS

## 2018-02-17 MED ORDER — LACTATED RINGERS IV SOLN
INTRAVENOUS | Status: DC | PRN
  Administered 2018-02-17: 07:00:00 via INTRAVENOUS

## 2018-02-17 MED ORDER — BUPIVACAINE-EPINEPHRINE 0.25% -1:200000 IJ SOLN
INTRAMUSCULAR | Status: DC | PRN
  Administered 2018-02-17: 17 mL

## 2018-02-17 MED ORDER — 0.9 % SODIUM CHLORIDE (POUR BTL) OPTIME
TOPICAL | Status: DC | PRN
  Administered 2018-02-17: 1000 mL

## 2018-02-17 MED ORDER — OXYCODONE HCL 5 MG PO TABS: 5.0000 mg | ORAL_TABLET | Freq: Once | ORAL | Status: DC | PRN

## 2018-02-17 MED ORDER — SUGAMMADEX SODIUM 500 MG/5ML IV SOLN
INTRAVENOUS | Status: AC
  Filled 2018-02-17: qty 5

## 2018-02-17 MED ORDER — FENTANYL CITRATE (PF) 100 MCG/2ML IJ SOLN
25.0000 ug | INTRAMUSCULAR | Status: DC | PRN
  Administered 2018-02-17 (×4): 25 ug via INTRAVENOUS

## 2018-02-17 MED ORDER — IOPAMIDOL (ISOVUE-300) INJECTION 61%
INTRAVENOUS | Status: AC
  Filled 2018-02-17: qty 50

## 2018-02-17 SURGICAL SUPPLY — 43 items
APPLIER CLIP 5 13 M/L LIGAMAX5 (MISCELLANEOUS) ×2
BLADE CLIPPER SURG (BLADE) IMPLANT
CANISTER SUCT 3000ML PPV (MISCELLANEOUS) ×2 IMPLANT
CHLORAPREP W/TINT 26ML (MISCELLANEOUS) ×2 IMPLANT
CLIP APPLIE 5 13 M/L LIGAMAX5 (MISCELLANEOUS) ×1 IMPLANT
COVER MAYO STAND STRL (DRAPES) ×2 IMPLANT
COVER SURGICAL LIGHT HANDLE (MISCELLANEOUS) ×2 IMPLANT
DERMABOND ADVANCED (GAUZE/BANDAGES/DRESSINGS) ×1
DERMABOND ADVANCED .7 DNX12 (GAUZE/BANDAGES/DRESSINGS) ×1 IMPLANT
DRAPE C-ARM 42X72 X-RAY (DRAPES) ×2 IMPLANT
ELECT REM PT RETURN 9FT ADLT (ELECTROSURGICAL) ×2
ELECTRODE REM PT RTRN 9FT ADLT (ELECTROSURGICAL) ×1 IMPLANT
FILTER SMOKE EVAC LAPAROSHD (FILTER) IMPLANT
GLOVE BIO SURGEON STRL SZ8 (GLOVE) ×2 IMPLANT
GLOVE BIOGEL PI IND STRL 8 (GLOVE) ×1 IMPLANT
GLOVE BIOGEL PI INDICATOR 8 (GLOVE) ×1
GOWN STRL REUS W/ TWL LRG LVL3 (GOWN DISPOSABLE) ×2 IMPLANT
GOWN STRL REUS W/ TWL XL LVL3 (GOWN DISPOSABLE) ×1 IMPLANT
GOWN STRL REUS W/TWL LRG LVL3 (GOWN DISPOSABLE) ×2
GOWN STRL REUS W/TWL XL LVL3 (GOWN DISPOSABLE) ×1
KIT BASIN OR (CUSTOM PROCEDURE TRAY) ×2 IMPLANT
KIT TURNOVER KIT B (KITS) ×2 IMPLANT
L-HOOK LAP DISP 36CM (ELECTROSURGICAL) ×2
LHOOK LAP DISP 36CM (ELECTROSURGICAL) ×1 IMPLANT
NEEDLE 22X1 1/2 (OR ONLY) (NEEDLE) ×2 IMPLANT
NS IRRIG 1000ML POUR BTL (IV SOLUTION) ×2 IMPLANT
PAD ARMBOARD 7.5X6 YLW CONV (MISCELLANEOUS) ×2 IMPLANT
PENCIL BUTTON HOLSTER BLD 10FT (ELECTRODE) ×2 IMPLANT
POUCH RETRIEVAL ECOSAC 10 (ENDOMECHANICALS) ×1 IMPLANT
POUCH RETRIEVAL ECOSAC 10MM (ENDOMECHANICALS) ×1
SCISSORS LAP 5X35 DISP (ENDOMECHANICALS) ×2 IMPLANT
SET CHOLANGIOGRAPH 5 50 .035 (SET/KITS/TRAYS/PACK) ×2 IMPLANT
SET IRRIG TUBING LAPAROSCOPIC (IRRIGATION / IRRIGATOR) ×2 IMPLANT
SLEEVE ENDOPATH XCEL 5M (ENDOMECHANICALS) ×4 IMPLANT
SPECIMEN JAR SMALL (MISCELLANEOUS) ×2 IMPLANT
SUT VIC AB 4-0 PS2 27 (SUTURE) ×2 IMPLANT
TOWEL OR 17X24 6PK STRL BLUE (TOWEL DISPOSABLE) ×2 IMPLANT
TOWEL OR 17X26 10 PK STRL BLUE (TOWEL DISPOSABLE) ×2 IMPLANT
TRAY LAPAROSCOPIC MC (CUSTOM PROCEDURE TRAY) ×2 IMPLANT
TROCAR XCEL BLUNT TIP 100MML (ENDOMECHANICALS) ×2 IMPLANT
TROCAR XCEL NON-BLD 5MMX100MML (ENDOMECHANICALS) ×2 IMPLANT
TUBING INSUFFLATION (TUBING) ×2 IMPLANT
WATER STERILE IRR 1000ML POUR (IV SOLUTION) ×2 IMPLANT

## 2018-02-17 NOTE — Transfer of Care (Signed)
Immediate Anesthesia Transfer of Care Note  Patient: Erin Pena  Procedure(s) Performed: LAPAROSCOPIC CHOLECYSTECTOMY (N/A Abdomen)  Patient Location: PACU  Anesthesia Type:General  Level of Consciousness: drowsy  Airway & Oxygen Therapy: Patient Spontanous Breathing and Patient connected to face mask oxygen  Post-op Assessment: Report given to RN, Post -op Vital signs reviewed and stable and Patient moving all extremities X 4  Post vital signs: Reviewed and stable  Last Vitals:  Vitals Value Taken Time  BP    Temp    Pulse 79 02/17/2018  8:42 AM  Resp 24 02/17/2018  8:42 AM  SpO2 100 % 02/17/2018  8:42 AM  Vitals shown include unvalidated device data.  Last Pain:  Vitals:   02/17/18 [id number]  TempSrc:   PainSc: 0-No pain         Complications: No apparent anesthesia complications

## 2018-02-17 NOTE — Anesthesia Procedure Notes (Signed)
Procedure Name: Intubation Date/Time: 02/17/2018 7:39 AM Performed by: Waynard EdwardsSmith, Jasiel Belisle A, CRNA Pre-anesthesia Checklist: Patient identified, Emergency Drugs available, Suction available and Patient being monitored Patient Re-evaluated:Patient Re-evaluated prior to induction Oxygen Delivery Method: Circle system utilized Preoxygenation: Pre-oxygenation with 100% oxygen Induction Type: IV induction Ventilation: Mask ventilation without difficulty Laryngoscope Size: Miller and 2 Grade View: Grade I Tube type: Oral Tube size: 7.0 mm Number of attempts: 1 Airway Equipment and Method: Stylet Placement Confirmation: ETT inserted through vocal cords under direct vision,  positive ETCO2 and breath sounds checked- equal and bilateral Secured at: 21 cm Tube secured with: Tape Dental Injury: Teeth and Oropharynx as per pre-operative assessment

## 2018-02-17 NOTE — Interval H&P Note (Signed)
History and Physical Interval Note:  02/17/2018 6:51 AM  [name]  has presented today for surgery, with the diagnosis of Symptomatic cholelithiasis  The various methods of treatment have been discussed with the patient and family. After consideration of risks, benefits and other options for treatment, the patient has consented to  Procedure(s): LAPAROSCOPIC CHOLECYSTECTOMY WITH INTRAOPERATIVE CHOLANGIOGRAM (N/A) as a surgical intervention .  The patient's history has been reviewed, patient examined, no change in status, stable for surgery.  I have reviewed the patient's chart and labs.  Questions were answered to the patient's satisfaction.     Liz MaladyBurke E Salwa Bai

## 2018-02-17 NOTE — Op Note (Signed)
02/17/2018  8:27 AM  PATIENT:  [name]  32 y.o. female  PRE-OPERATIVE DIAGNOSIS:  Symptomatic cholelithiasis  POST-OPERATIVE DIAGNOSIS: Chronic cholecystitis with cholelithiasis  PROCEDURE:  Procedure(s): LAPAROSCOPIC CHOLECYSTECTOMY  SURGEON:  Surgeon(s): Violeta Gelinashompson, Zakhi Dupre, MD  ASSISTANTS: none   ANESTHESIA:   local and general  EBL:  Total I/O In: 800 [I.V.:800] Out: 5 [Blood:5]  BLOOD ADMINISTERED:none  DRAINS: none   SPECIMEN:  Excision  DISPOSITION OF SPECIMEN:  PATHOLOGY  COUNTS:  YES  DICTATION: .Dragon Dictation Findings: Multiple gallstones and evidence of chronic cholecystitis, cystic duct too small to perform cholangiogram  Procedure in detail: Ms. [name] presents for cholecystectomy.  She was identified in the preop holding area.  Informed consent was obtained.  She received intravenous antibiotics.  She was brought to the operating room and general endotracheal anesthesia was administered by the anesthesia staff.  Her abdomen was prepped and draped in sterile fashion.  We did a timeout procedure.The infraumbilical region was infiltrated with local. Supraumbilical incision was made. Subcutaneous tissues were dissected down revealing the anterior fascia. This was divided sharply along the midline. Peritoneal cavity was entered under direct vision without complication. A 0 Vicryl pursestring was placed around the fascial opening. Hassan trocar was inserted into the abdomen. The abdomen was insufflated with carbon dioxide in standard fashion. Under direct vision a 5 mm epigastric and 2 5 mm right lateral ports were placed.  Local was used at each port site.  Laparoscopic expiration revealed evidence of chronic cholecystitis with omental adhesions to the body and infundibulum of the gallbladder.  The dome of the gallbladder was retracted superior medially.  The infundibulum was gradually revealed by dissecting free the omental adhesions.  The infundibulum was then  retracted inferior laterally.  Dissection began laterally and progressed medially easily identifying the cystic duct and cystic artery.  Dissection continued until a critical view was obtained.  At this time, clip was placed on the infundibular cystic duct junction.  Her cystic duct was too small and short to perform a cholangiogram.  I placed 3 clips proximally on the cystic duct and it was divided.  2 clips were placed proximally in the cystic artery, one was placed distally and it was divided.  The gallbladder was taken off the liver bed using cautery and achieving excellent hemostasis.  The gallbladder was placed in a bag and removed from the abdomen.  It was sent to pathology.  Liver bed was checked and hemostasis was ensured.  All clips remain in good position.  Four-quadrant inspection revealed no other abnormalities or complicating features.  Ports were removed under direct vision.  Pneumoperitoneum was released.  Supraumbilical fascia was closed by tying the pursestring.  All 4 wounds were irrigated and the skin of each was closed with 4-0 Vicryl and Dermabond.  All counts were correct.  She tolerated the procedure well without apparent complication and was taken recovery in stable condition.  PATIENT DISPOSITION:  PACU - hemodynamically stable.   Delay start of Pharmacological VTE agent (>24hrs) due to surgical blood loss or risk of bleeding:  no  Violeta GelinasBurke Sue Fernicola, MD, MPH, FACS Pager: [phone]  7/18/20198:27 AM

## 2018-02-17 NOTE — Anesthesia Preprocedure Evaluation (Signed)
Anesthesia Evaluation  Patient identified by MRN, date of birth, ID band Patient awake    Reviewed: Allergy & Precautions, NPO status , Patient's Chart, lab work & pertinent test results  History of Anesthesia Complications Negative for: history of anesthetic complications  Airway Mallampati: I  TM Distance: >3 FB Neck ROM: Full    Dental  (+) Teeth Intact   Pulmonary neg pulmonary ROS,    breath sounds clear to auscultation       Cardiovascular negative cardio ROS   Rhythm:Regular     Neuro/Psych negative neurological ROS  negative psych ROS   GI/Hepatic Neg liver ROS, Gall stones   Endo/Other  Morbid obesity  Renal/GU negative Renal ROS     Musculoskeletal negative musculoskeletal ROS (+)   Abdominal   Peds  Hematology negative hematology ROS (+)   Anesthesia Other Findings   Reproductive/Obstetrics                             Anesthesia Physical Anesthesia Plan  ASA: II  Anesthesia Plan: General   Post-op Pain Management:    Induction: Intravenous  PONV Risk Score and Plan: 3 and Ondansetron, Dexamethasone and Midazolam  Airway Management Planned: Oral ETT  Additional Equipment: None  Intra-op Plan:   Post-operative Plan: Extubation in OR  Informed Consent: I have reviewed the patients History and Physical, chart, labs and discussed the procedure including the risks, benefits and alternatives for the proposed anesthesia with the patient or authorized representative who has indicated his/her understanding and acceptance.   Dental advisory given  Plan Discussed with: CRNA and Surgeon  Anesthesia Plan Comments:         Anesthesia Quick Evaluation

## 2018-02-17 NOTE — H&P (Signed)
Erin Pena is an 32 y.o. female.   Chief Complaint: RUQ pain HPI: Erin Pena presents for laparoscopic cholecystectomy with IOC for symptomatic cholelithiasis.  Past Medical History:  Diagnosis Date  . Bronchitis   . Complication of anesthesia    never had anesthesia  . Heart murmur     History reviewed. No pertinent surgical history.  Family History  Problem Relation Age of Onset  . Hypertension Father    Social History:  reports that she has never smoked. She has never used smokeless tobacco. She reports that she drinks alcohol. She reports that she does not use drugs.  Allergies: No Known Allergies  Medications Prior to Admission  Medication Sig Dispense Refill  . Multiple Vitamin (MULTIVITAMIN) capsule Take 1 capsule by mouth daily.      Results for orders placed or performed during the hospital encounter of 02/17/18 (from the past 48 hour(s))  Pregnancy, urine POC     Status: None   Collection Time: 02/17/18  6:19 AM  Result Value Ref Range   Preg Test, Ur NEGATIVE NEGATIVE    Comment:        THE SENSITIVITY OF THIS METHODOLOGY IS >24 mIU/mL    No results found.  ROS  Blood pressure (!) 164/81, pulse 76, temperature (!) 97.5 F (36.4 C), temperature source Oral, resp. rate 20, height 5' (1.524 m), weight 121.6 kg (268 lb), last menstrual period 11/24/2017, SpO2 100 %. Physical Exam  Constitutional: She is oriented to person, place, and time. She appears well-developed and well-nourished. No distress.  HENT:  Head: Normocephalic.  Eyes: Pupils are equal, round, and reactive to light.  Neck: Neck supple.  Cardiovascular: Normal rate and regular rhythm.  Respiratory: Effort normal and breath sounds normal. No respiratory distress. She has no wheezes. She has no rales.  GI: Soft. She exhibits no distension. There is no tenderness. There is no rebound and no guarding.  Musculoskeletal: She exhibits no edema.  Neurological: She is alert and oriented to person,  place, and time.  Skin: Skin is warm.  Psychiatric: She has a normal mood and affect.     Assessment/Plan Symptomatic cholelithiasis - for laparoscopic cholecystectomy with IOC. I again discussed the procedure, risks, benefits and expected post-op course. She agrees.  Liz MaladyBurke E Calvin Chura, MD 02/17/2018, 6:38 AM

## 2018-02-18 ENCOUNTER — Encounter (HOSPITAL_COMMUNITY): Payer: Self-pay | Admitting: General Surgery

## 2018-02-18 NOTE — Anesthesia Postprocedure Evaluation (Signed)
Anesthesia Post Note  Patient: Erin Pena  Procedure(s) Performed: LAPAROSCOPIC CHOLECYSTECTOMY (N/A Abdomen)     Patient location during evaluation: PACU Anesthesia Type: General Level of consciousness: awake and alert Pain management: pain level controlled Vital Signs Assessment: post-procedure vital signs reviewed and stable Respiratory status: spontaneous breathing, nonlabored ventilation, respiratory function stable and patient connected to nasal cannula oxygen Cardiovascular status: blood pressure returned to baseline and stable Postop Assessment: no apparent nausea or vomiting Anesthetic complications: no    Last Vitals:  Vitals:   02/17/18 1027 02/17/18 1100  BP:  (!) 156/97  Pulse:  65  Resp:  14  Temp: (!) 36.3 C (!) 36.3 C  SpO2:  100%    Last Pain:  Vitals:   02/17/18 1030  TempSrc:   PainSc: 4                  Laniah Grimm

## 2018-10-18 ENCOUNTER — Telehealth: Payer: BLUE CROSS/BLUE SHIELD | Admitting: Family

## 2018-10-18 DIAGNOSIS — N930 Postcoital and contact bleeding: Secondary | ICD-10-CM

## 2018-10-18 NOTE — Progress Notes (Signed)
Based on what you shared with me, I feel your condition warrants further evaluation and I recommend that you be seen for a face to face office visit.   NOTE: If you entered your credit card information for this eVisit, you will not be charged. You may see a "hold" on your card for the $35 but that hold will drop off and you will not have a charge processed.  If you are having a true medical emergency please call 911.  If you need an urgent face to face visit, [facility] has four urgent care centers for your convenience.  If you need care fast and have a high deductible or no insurance consider:   [email or web address] to reserve your spot online an avoid wait times  [facility] [address], [address] [place], [place] [address] 8 am to 8 pm Monday-Friday 10 am to 4 pm Saturday-Sunday *Across the street from [employer]  [facility]  [address] [place], [address] 8 am to 5 pm Monday-Friday * In the [facility] the [facility]   The following sites will take your insurance:  . [facility]  [phone] Get Driving Directions Find a Provider at this Location  [address] , [place] [address] . 10 am to 8 pm Monday-Friday . 12 pm to 8 pm Saturday-Sunday   . [facility]  [phone] Get Driving Directions Find a Provider at this Location  [address], [address] [place], [place] [address] . 8 am to 8 pm Monday-Friday . 9 am to 6 pm Saturday . 11 am to 6 pm Sunday   . [facility] [place]  [phone] Get Driving Directions  [address] [place], [place] [address] . 8 am to 8 pm Monday-Friday . 8 am to 4 pm Saturday-Sunday   Your e-visit answers were reviewed by a board certified advanced clinical practitioner to complete your personal care plan.  Thank you for using e-Visits.  

## 2018-11-10 ENCOUNTER — Ambulatory Visit: Payer: BLUE CROSS/BLUE SHIELD | Admitting: Obstetrics and Gynecology

## 2019-05-30 ENCOUNTER — Other Ambulatory Visit: Payer: Self-pay | Admitting: Registered"

## 2019-05-30 DIAGNOSIS — Z20822 Contact with and (suspected) exposure to covid-19: Secondary | ICD-10-CM

## 2019-06-01 LAB — NOVEL CORONAVIRUS, NAA: SARS-CoV-2, NAA: NOT DETECTED

## 2021-06-25 ENCOUNTER — Emergency Department (HOSPITAL_COMMUNITY)
Admission: EM | Admit: 2021-06-25 | Discharge: 2021-06-25 | Disposition: A | Discharge status: Home / Self Care | Payer: BLUE CROSS/BLUE SHIELD | Attending: Emergency Medicine | Admitting: Emergency Medicine

## 2021-06-25 ENCOUNTER — Emergency Department (HOSPITAL_COMMUNITY): Payer: BLUE CROSS/BLUE SHIELD

## 2021-06-25 ENCOUNTER — Encounter (HOSPITAL_COMMUNITY): Payer: Self-pay

## 2021-06-25 DIAGNOSIS — R109 Unspecified abdominal pain: Secondary | ICD-10-CM

## 2021-06-25 DIAGNOSIS — R1032 Left lower quadrant pain: Secondary | ICD-10-CM | POA: Diagnosis present

## 2021-06-25 DIAGNOSIS — R1012 Left upper quadrant pain: Secondary | ICD-10-CM | POA: Diagnosis not present

## 2021-06-25 DIAGNOSIS — N9489 Other specified conditions associated with female genital organs and menstrual cycle: Secondary | ICD-10-CM | POA: Diagnosis not present

## 2021-06-25 DIAGNOSIS — M546 Pain in thoracic spine: Secondary | ICD-10-CM | POA: Diagnosis not present

## 2021-06-25 LAB — COMPREHENSIVE METABOLIC PANEL
ALT: 97 U/L — ABNORMAL HIGH (ref 0–44)
AST: 138 U/L — ABNORMAL HIGH (ref 15–41)
Albumin: 4.2 g/dL (ref 3.5–5.0)
Alkaline Phosphatase: 136 U/L — ABNORMAL HIGH (ref 38–126)
Anion gap: 10 (ref 5–15)
BUN: 16 mg/dL (ref 6–20)
CO2: 23 mmol/L (ref 22–32)
Calcium: 9.5 mg/dL (ref 8.9–10.3)
Chloride: 105 mmol/L (ref 98–111)
Creatinine, Ser: 0.6 mg/dL (ref 0.44–1.00)
GFR, Estimated: >60 mL/min (ref >60)
Glucose, Bld: 100 mg/dL — ABNORMAL HIGH (ref 70–99)
Potassium: 4.3 mmol/L (ref 3.5–5.1)
Sodium: 138 mmol/L (ref 135–145)
Total Bilirubin: 1.2 mg/dL (ref 0.3–1.2)
Total Protein: 8.6 g/dL — ABNORMAL HIGH (ref 6.5–8.1)

## 2021-06-25 LAB — CBC WITH DIFFERENTIAL/PLATELET
Abs Immature Granulocytes: 0.03 10*3/uL (ref 0.00–0.07)
Basophils Absolute: 0 10*3/uL (ref 0.0–0.1)
Basophils Relative: 0 %
Eosinophils Absolute: 0 10*3/uL (ref 0.0–0.5)
Eosinophils Relative: 0 %
HCT: 43.7 % (ref 36.0–46.0)
Hemoglobin: 14.2 g/dL (ref 12.0–15.0)
Immature Granulocytes: 0 %
Lymphocytes Relative: 15 %
Lymphs Abs: 1.3 10*3/uL (ref 0.7–4.0)
MCH: 27.7 pg (ref 26.0–34.0)
MCHC: 32.5 g/dL (ref 30.0–36.0)
MCV: 85.4 fL (ref 80.0–100.0)
Monocytes Absolute: 0.8 10*3/uL (ref 0.1–1.0)
Monocytes Relative: 10 %
Neutro Abs: 6.5 10*3/uL (ref 1.7–7.7)
Neutrophils Relative %: 75 %
Platelets: 249 10*3/uL (ref 150–400)
RBC: 5.12 MIL/uL — ABNORMAL HIGH (ref 3.87–5.11)
RDW: 13.2 % (ref 11.5–15.5)
WBC: 8.7 10*3/uL (ref 4.0–10.5)
nRBC: 0 % (ref 0.0–0.2)

## 2021-06-25 LAB — URINALYSIS, ROUTINE W REFLEX MICROSCOPIC
Bilirubin Urine: NEGATIVE
Glucose, UA: NEGATIVE mg/dL
Ketones, ur: 20 mg/dL — AB
Leukocytes,Ua: NEGATIVE
Nitrite: NEGATIVE
Protein, ur: NEGATIVE mg/dL
Specific Gravity, Urine: 1.015 (ref 1.005–1.030)
pH: 6 (ref 5.0–8.0)

## 2021-06-25 LAB — I-STAT BETA HCG BLOOD, ED (MC, WL, AP ONLY): I-stat hCG, quantitative: <5 m[IU]/mL (ref <5)

## 2021-06-25 MED ORDER — NAPROXEN 375 MG PO TABS: 375.0000 mg | ORAL_TABLET | Freq: Two times a day (BID) | ORAL | 0 refills | Status: AC

## 2021-06-25 MED ORDER — ONDANSETRON HCL 4 MG/2ML IJ SOLN
4.0000 mg | Freq: Once | INTRAMUSCULAR | Status: AC
  Administered 2021-06-25: 4 mg via INTRAVENOUS
  Filled 2021-06-25: qty 2

## 2021-06-25 MED ORDER — FENTANYL CITRATE PF 50 MCG/ML IJ SOSY
100.0000 ug | PREFILLED_SYRINGE | Freq: Once | INTRAMUSCULAR | Status: AC
  Administered 2021-06-25: 100 ug via INTRAVENOUS
  Filled 2021-06-25: qty 2

## 2021-06-25 MED ORDER — KETOROLAC TROMETHAMINE 30 MG/ML IJ SOLN
15.0000 mg | Freq: Once | INTRAMUSCULAR | Status: AC
  Administered 2021-06-25: 15 mg via INTRAVENOUS
  Filled 2021-06-25: qty 1

## 2021-06-25 NOTE — ED Provider Notes (Signed)
[facility]-EMERGENCY DEPT Provider Note   CSN: [id number] Arrival date & time: 06/25/21  1139     History Chief Complaint  Patient presents with   Flank Pain    Erin Pena is a 35 y.o. female.  HPI    35 year old female comes in with chief complaint of flank pain.  She has history of cholecystectomy, nonalcoholic fatty liver disease.  Patient reports that she started having left upper abdominal pain radiating to the flank region, into her mid back prior to ED arrival.  The pain started about 2 hours ago, it is constant, sharp with no specific evoking, aggravating or relieving factors.  She had similar symptoms several months back that lasted 2 or 3 days, at that time she went to urgent care and did not get a diagnosis.  Patient denies any burning with urination, blood in the urine, vaginal discharge, bleeding.  There is no history of kidney stone or any pelvic disorders.  She denies heavy smoking, drinking or drug use.  Past Medical History:  Diagnosis Date   Bronchitis    Complication of anesthesia    never had anesthesia   Heart murmur     Patient Active Problem List   Diagnosis Date Noted   Irregular menses 01/07/2018   NAFL (nonalcoholic fatty liver) 01/07/2018   Insulin resistance 01/07/2018   Morbid obesity (HCC) 01/07/2018   Calculus of gallbladder with chronic cholecystitis without obstruction 01/07/2018   Benign cardiac murmur 01/07/2018    Past Surgical History:  Procedure Laterality Date   CHOLECYSTECTOMY N/A 02/17/2018   Procedure: LAPAROSCOPIC CHOLECYSTECTOMY;  Surgeon: Violeta Gelinas, MD;  Location: [facility] OR;  Service: General;  Laterality: N/A;     OB History     Gravida  0   Para  0   Term  0   Preterm  0   AB  0   Living  0      SAB  0   IAB  0   Ectopic  0   Multiple  0   Live Births  0           Family History  Problem Relation Age of Onset   Hypertension Father     Social History   Tobacco  Use   Smoking status: Never   Smokeless tobacco: Never  Vaping Use   Vaping Use: Never used  Substance Use Topics   Alcohol use: Yes    Comment: socially "occasionally"   Drug use: No    Home Medications Prior to Admission medications   Medication Sig Start Date End Date Taking? Authorizing Provider  naproxen (NAPROSYN) 375 MG tablet Take 1 tablet (375 mg total) by mouth 2 (two) times daily. 06/25/21  Yes Pricilla Loveless, MD  Multiple Vitamin (MULTIVITAMIN) capsule Take 1 capsule by mouth daily.    [provider]  oxyCODONE (OXY IR/ROXICODONE) 5 MG immediate release tablet Take 1 tablet (5 mg total) by mouth every 6 (six) hours as needed for severe pain. 02/17/18   Violeta Gelinas, MD    Allergies    Patient has no known allergies.  Review of Systems   Review of Systems  Constitutional:  Positive for activity change.  Gastrointestinal:  Positive for abdominal pain and nausea. Negative for vomiting.  Genitourinary:  Positive for flank pain. Negative for dysuria.  Allergic/Immunologic: Negative for immunocompromised state.  Hematological:  Does not bruise/bleed easily.  All other systems reviewed and are negative.  Physical Exam Updated Vital Signs BP Marland Kitchen)  148/101 (BP Location: Right Wrist)   Pulse 70   Temp 98.2 F (36.8 C) (Oral)   Resp 18   Ht 5' (1.524 m)   Wt 121.6 kg   SpO2 100%   BMI 52.34 kg/m   Physical Exam Vitals and nursing note reviewed.  Constitutional:      Appearance: She is well-developed.  HENT:     Head: Atraumatic.  Cardiovascular:     Rate and Rhythm: Normal rate.  Pulmonary:     Effort: Pulmonary effort is normal.  Abdominal:     Tenderness: There is abdominal tenderness.  Musculoskeletal:     Cervical back: Normal range of motion and neck supple.  Skin:    General: Skin is warm and dry.  Neurological:     Mental Status: She is alert and oriented to person, place, and time.    ED Results / Procedures / Treatments    Labs (all labs ordered are listed, but only abnormal results are displayed) Labs Reviewed  COMPREHENSIVE METABOLIC PANEL - Abnormal; Notable for the following components:      Result Value   Glucose, Bld 100 (*)    Total Protein 8.6 (*)    AST 138 (*)    ALT 97 (*)    Alkaline Phosphatase 136 (*)    All other components within normal limits  CBC WITH DIFFERENTIAL/PLATELET - Abnormal; Notable for the following components:   RBC 5.12 (*)    All other components within normal limits  URINALYSIS, ROUTINE W REFLEX MICROSCOPIC - Abnormal; Notable for the following components:   APPearance HAZY (*)    Hgb urine dipstick LARGE (*)    Ketones, ur 20 (*)    Bacteria, UA FEW (*)    All other components within normal limits  I-STAT BETA HCG BLOOD, ED ([facility], [facility], [facility] ONLY)    EKG None  Radiology US Transvaginal Non-OB  Result Date: 06/25/2021 CLINICAL DATA:  Left flank and left lower quadrant pain EXAM: TRANSABDOMINAL AND TRANSVAGINAL ULTRASOUND OF PELVIS DOPPLER ULTRASOUND OF OVARIES TECHNIQUE: Both transabdominal and transvaginal ultrasound examinations of the pelvis were performed. Transabdominal technique was performed for global imaging of the pelvis including uterus, ovaries, adnexal regions, and pelvic cul-de-sac. It was necessary to proceed with endovaginal exam following the transabdominal exam to visualize the endometrium. Color and duplex Doppler ultrasound was utilized to evaluate blood flow to the ovaries. COMPARISON:  06/25/2021 FINDINGS: Uterus Measurements: 8.4 x 2.8 x 4.2 cm = volume: 51.6 ML. No fibroids or other mass visualized. Endometrium Thickness: 3 mm. No focal abnormality. IUD is identified in the expected position within the endometrial cavity. Right ovary Measurements: 2.4 x 2.4 x 2.6 cm = volume: 8.1 mL. Normal appearance/no adnexal mass. Left ovary Measurements: 3.1 x 2.7 x 3.0 cm = volume: 12.9 mL. Normal appearance/no adnexal mass. Pulsed Doppler evaluation of both  ovaries demonstrates normal low-resistance arterial and venous waveforms. Other findings No abnormal free fluid. IMPRESSION: 1. IUD within the endometrial cavity. Otherwise unremarkable age-appropriate pelvic ultrasound. Electronically Signed   By: Sharlet Salina M.D.   On: 06/25/2021 17:54   US Pelvis Complete  Result Date: 06/25/2021 CLINICAL DATA:  Left flank and left lower quadrant pain EXAM: TRANSABDOMINAL AND TRANSVAGINAL ULTRASOUND OF PELVIS DOPPLER ULTRASOUND OF OVARIES TECHNIQUE: Both transabdominal and transvaginal ultrasound examinations of the pelvis were performed. Transabdominal technique was performed for global imaging of the pelvis including uterus, ovaries, adnexal regions, and pelvic cul-de-sac. It was necessary to proceed with endovaginal exam following the  transabdominal exam to visualize the endometrium. Color and duplex Doppler ultrasound was utilized to evaluate blood flow to the ovaries. COMPARISON:  06/25/2021 FINDINGS: Uterus Measurements: 8.4 x 2.8 x 4.2 cm = volume: 51.6 ML. No fibroids or other mass visualized. Endometrium Thickness: 3 mm. No focal abnormality. IUD is identified in the expected position within the endometrial cavity. Right ovary Measurements: 2.4 x 2.4 x 2.6 cm = volume: 8.1 mL. Normal appearance/no adnexal mass. Left ovary Measurements: 3.1 x 2.7 x 3.0 cm = volume: 12.9 mL. Normal appearance/no adnexal mass. Pulsed Doppler evaluation of both ovaries demonstrates normal low-resistance arterial and venous waveforms. Other findings No abnormal free fluid. IMPRESSION: 1. IUD within the endometrial cavity. Otherwise unremarkable age-appropriate pelvic ultrasound. Electronically Signed   By: Sharlet Salina M.D.   On: 06/25/2021 17:54   [place] Art/Ven Flow Abd Pelv Doppler  Result Date: 06/25/2021 CLINICAL DATA:  Left flank and left lower quadrant pain EXAM: TRANSABDOMINAL AND TRANSVAGINAL ULTRASOUND OF PELVIS DOPPLER ULTRASOUND OF OVARIES TECHNIQUE: Both transabdominal  and transvaginal ultrasound examinations of the pelvis were performed. Transabdominal technique was performed for global imaging of the pelvis including uterus, ovaries, adnexal regions, and pelvic cul-de-sac. It was necessary to proceed with endovaginal exam following the transabdominal exam to visualize the endometrium. Color and duplex Doppler ultrasound was utilized to evaluate blood flow to the ovaries. COMPARISON:  06/25/2021 FINDINGS: Uterus Measurements: 8.4 x 2.8 x 4.2 cm = volume: 51.6 ML. No fibroids or other mass visualized. Endometrium Thickness: 3 mm. No focal abnormality. IUD is identified in the expected position within the endometrial cavity. Right ovary Measurements: 2.4 x 2.4 x 2.6 cm = volume: 8.1 mL. Normal appearance/no adnexal mass. Left ovary Measurements: 3.1 x 2.7 x 3.0 cm = volume: 12.9 mL. Normal appearance/no adnexal mass. Pulsed Doppler evaluation of both ovaries demonstrates normal low-resistance arterial and venous waveforms. Other findings No abnormal free fluid. IMPRESSION: 1. IUD within the endometrial cavity. Otherwise unremarkable age-appropriate pelvic ultrasound. Electronically Signed   By: Sharlet Salina M.D.   On: 06/25/2021 17:54   CT Renal Stone Study  Result Date: 06/25/2021 CLINICAL DATA:  Acute left flank pain. EXAM: CT ABDOMEN AND PELVIS WITHOUT CONTRAST TECHNIQUE: Multidetector CT imaging of the abdomen and pelvis was performed following the standard protocol without IV contrast. COMPARISON:  None. FINDINGS: Lower chest: No acute abnormality. Hepatobiliary: No focal liver abnormality is seen. Status post cholecystectomy. No biliary dilatation. Pancreas: Unremarkable. No pancreatic ductal dilatation or surrounding inflammatory changes. Spleen: Normal in size without focal abnormality. Adrenals/Urinary Tract: Adrenal glands are unremarkable. Kidneys are normal, without renal calculi, focal lesion, or hydronephrosis. Bladder is unremarkable. Stomach/Bowel: Stomach is  within normal limits. Appendix appears normal. No evidence of bowel wall thickening, distention, or inflammatory changes. Diverticulosis of descending and sigmoid colon is noted without definite evidence of inflammation. Vascular/Lymphatic: No significant vascular findings are present. No enlarged abdominal or pelvic lymph nodes. Reproductive: Intrauterine device is noted. No adnexal abnormality is noted. Other: Small fat containing periumbilical hernia is noted. No ascites is noted. Musculoskeletal: No acute or significant osseous findings. IMPRESSION: Diverticulosis of descending and sigmoid colon is noted without definite evidence of inflammation at this time. Small fat containing periumbilical hernia. Electronically Signed   By: Lupita Raider M.D.   On: 06/25/2021 13:58    Procedures Procedures   Medications Ordered in ED Medications  fentaNYL (SUBLIMAZE) injection 100 mcg (100 mcg Intravenous Given 06/25/21 1313)  ondansetron (ZOFRAN) injection 4 mg (4 mg Intravenous Given 06/25/21  1313)  ketorolac (TORADOL) 30 MG/ML injection 15 mg (15 mg Intravenous Given 06/25/21 1538)    ED Course  I have reviewed the triage vital signs and the nursing notes.  Pertinent labs & imaging results that were available during my care of the patient were reviewed by me and considered in my medical decision making (see chart for details).    MDM Rules/Calculators/A&P                           Pt comes in with cc of L flank pain.  Differential diagnosis includes kidney stone, pyelonephritis, ectopic pregnancy, ovarian torsion.  No risk factors for vascular conditions like infarction/thrombosis.  Plan is to get basic labs, UA and a CT renal stone study.  On reassessment - still having pain. [place] ordered. If neg d.c.  Final Clinical Impression(s) / ED Diagnoses Final diagnoses:  Left sided abdominal pain    Rx / DC Orders ED Discharge Orders          Ordered    naproxen (NAPROSYN) 375 MG  tablet  2 times daily        06/25/21 1814             Derwood Kaplan, MD 06/26/21 [phone]

## 2021-06-25 NOTE — ED Triage Notes (Signed)
Pt arrived via POV, c/o LLQ abd pain and left flank pain radiating to back that started 2 hrs ago. Denies any other sx.

## 2021-06-25 NOTE — Discharge Instructions (Addendum)
If you develop worsening, continued, or recurrent abdominal pain, uncontrolled vomiting, fever, chest or back pain, or any other new/concerning symptoms then return to the ER for evaluation.   You are being prescribed Naproxen. Do NOT take Ibuprofen/Advil/Aleve/Motrin/Goody Powders/BC powders/Meloxicam/Diclofenac/Indomethacin and other Nonsteroidal anti-inflammatory medications

## 2021-06-25 NOTE — ED Provider Notes (Signed)
Care transferred to me.  There is no clear cause for her pain.  She has some mild LFT abnormalities but otherwise has a reassuring CT and pelvic ultrasound.  She previously has had her gallbladder removed.  Bilirubin is normal so I doubt choledocholithiasis.  At this point, there is no clear cause for symptoms.  She would like something similar to the Toradol she was given as this seemed to help and I will give her some naproxen.  Follow-up with PCP.   Pricilla Loveless, MD 06/25/21 [phone]

## 2022-05-14 IMAGING — CT CT RENAL STONE PROTOCOL
2 of 4 series · 16 of 46 positions shown, 18 images · non-contrast
Comparison: None.

CLINICAL DATA: Acute left flank pain.

EXAM:
CT ABDOMEN AND PELVIS WITHOUT CONTRAST
TECHNIQUE: Multidetector CT imaging of the abdomen and pelvis was performed
following the standard protocol without IV contrast.

[Series 2: axial st · axial · 0.78mm/px · z∈[+994,+1394]mm · 13 of 92 slices shown, 15 images]
[im 6/92  soft-tissue]
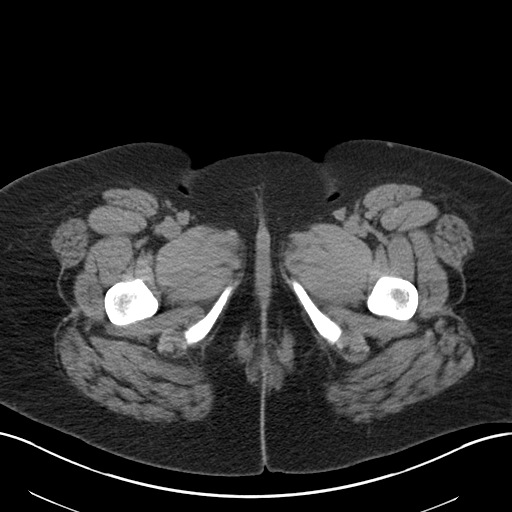
[im 6/92  bone]
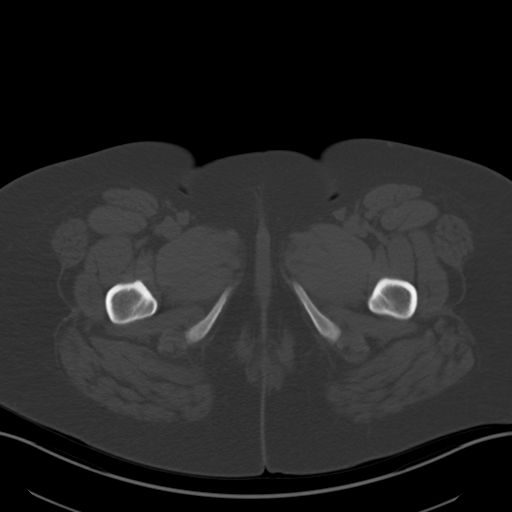
[im 11/92  soft-tissue]
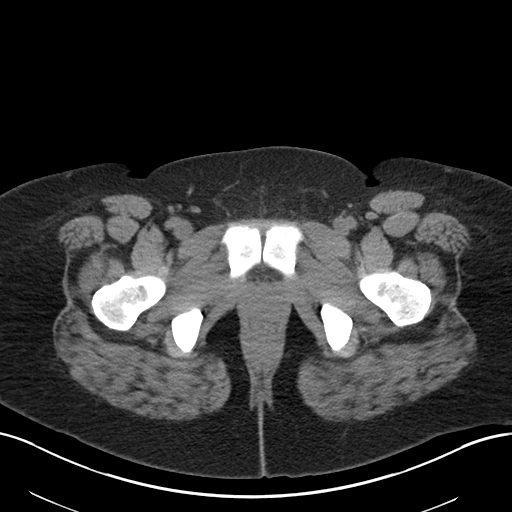
[im 21/92  soft-tissue]
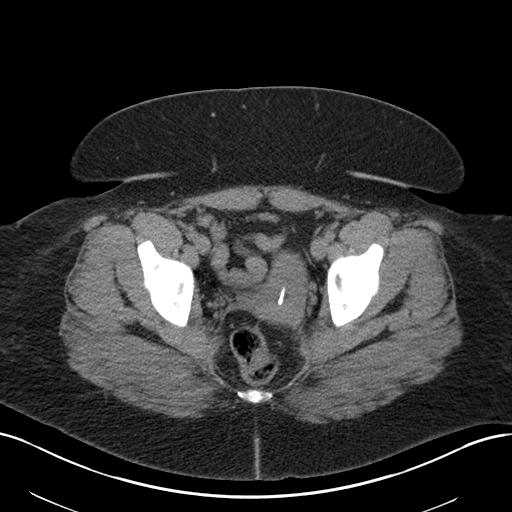
[im 26/92  soft-tissue]
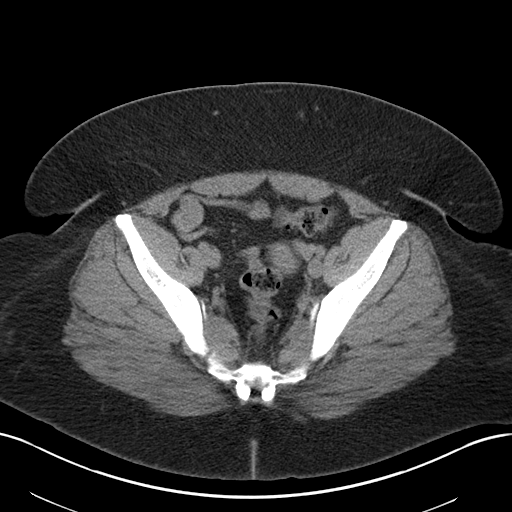
[im 31/92  soft-tissue]
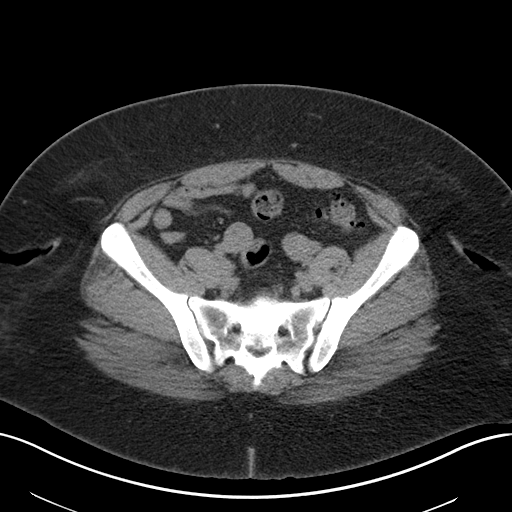
[im 41/92  soft-tissue]
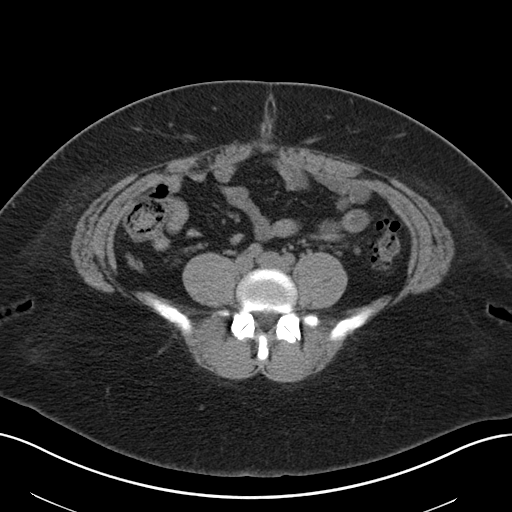
[im 46/92  soft-tissue]
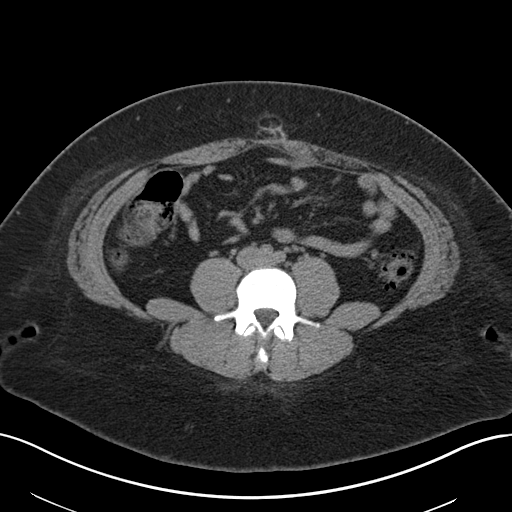
[im 51/92  soft-tissue]
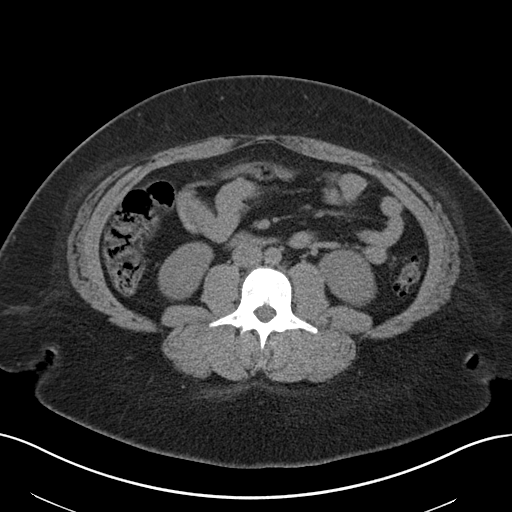
[im 61/92  soft-tissue]
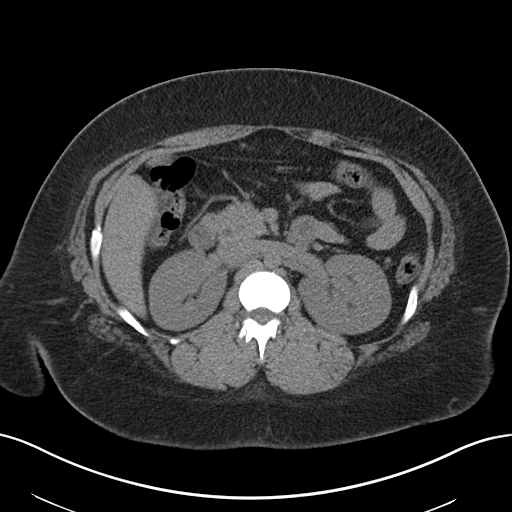
[im 61/92  bone]
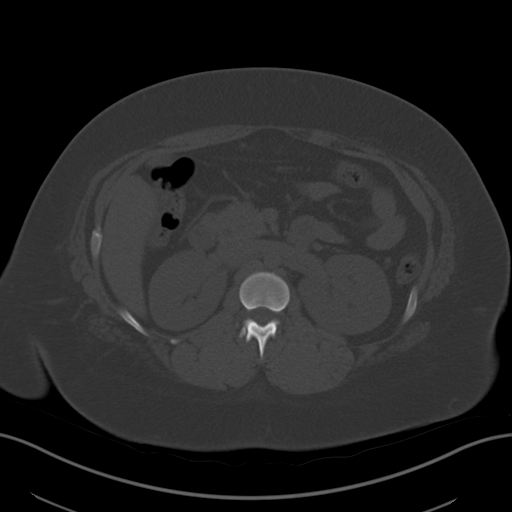
[im 66/92  soft-tissue]
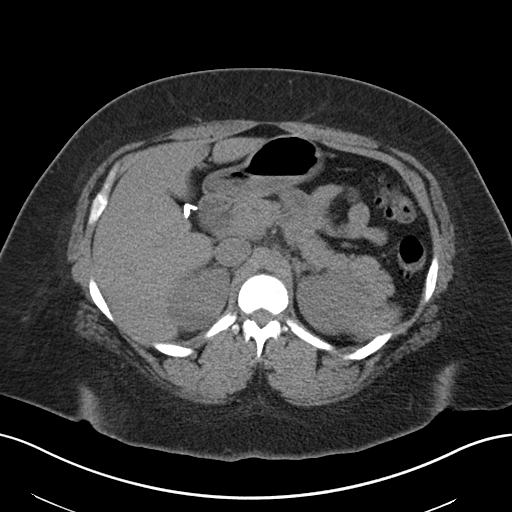
[im 71/92  soft-tissue]
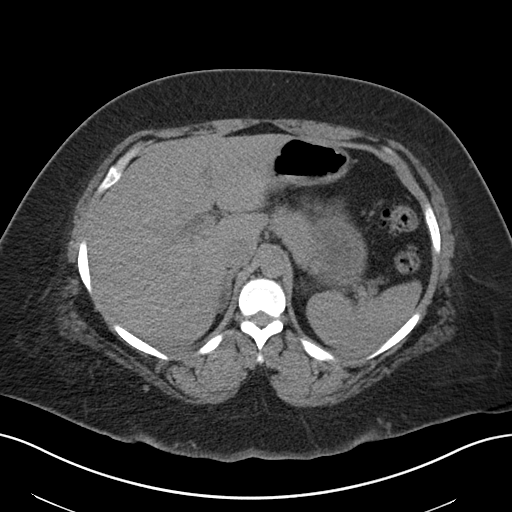
[im 81/92  soft-tissue]
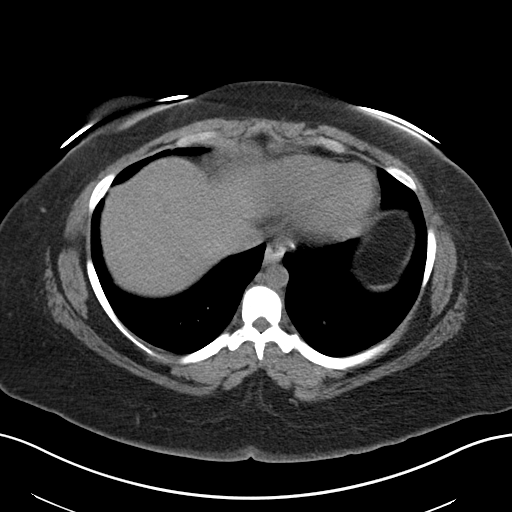
[im 86/92  soft-tissue]
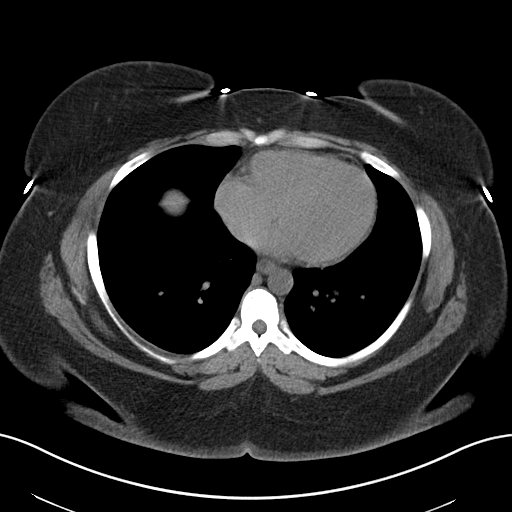

[Series 5: coronal · coronal · 0.77mm/px · 3 of 151 slices shown]
[im 51/151  soft-tissue]
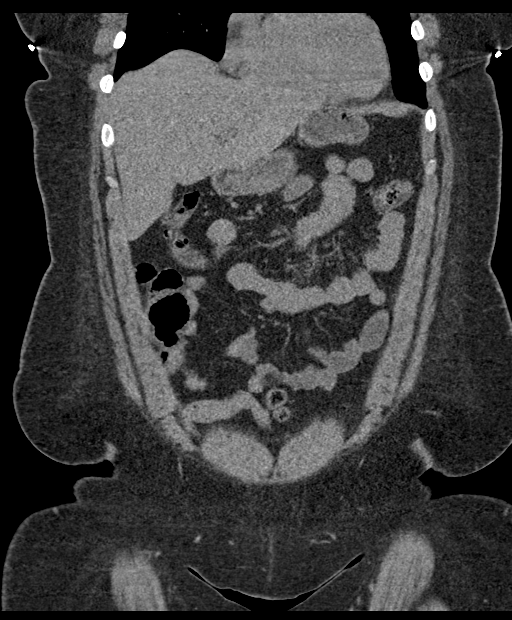
[im 67/151  soft-tissue]
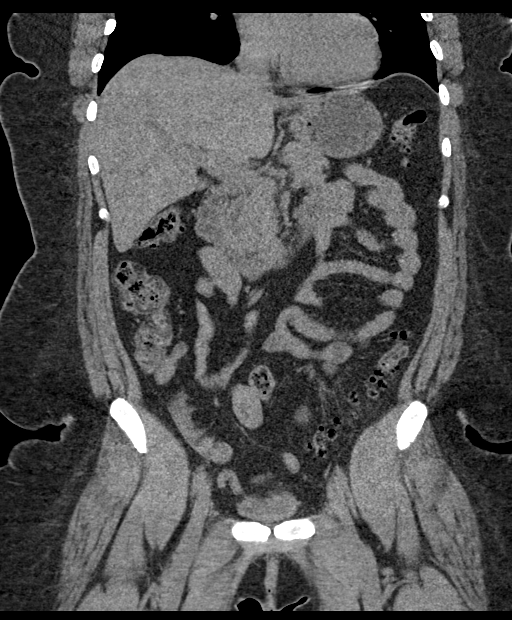
[im 84/151  soft-tissue]
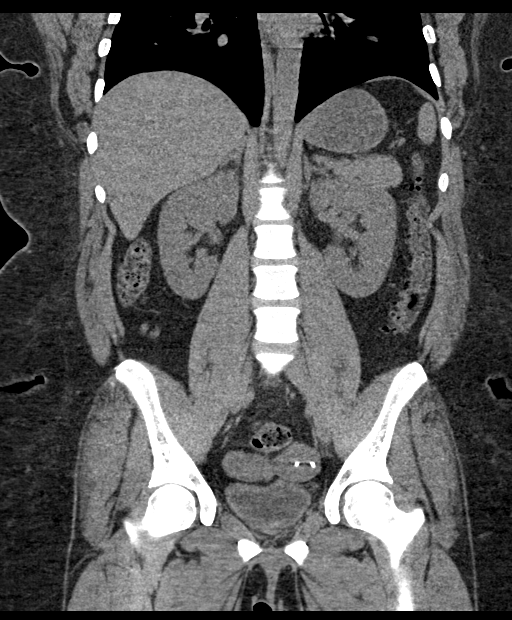

[16 of 46 positions shown; findings below may reference images not displayed]

FINDINGS: Lower chest: No acute abnormality.

Hepatobiliary: No focal liver abnormality is seen. Status post
cholecystectomy. No biliary dilatation.

Pancreas: Unremarkable. No pancreatic ductal dilatation or
surrounding inflammatory changes.

Spleen: Normal in size without focal abnormality.

Adrenals/Urinary Tract: Adrenal glands are unremarkable. Kidneys are
normal, without renal calculi, focal lesion, or hydronephrosis.
Bladder is unremarkable.

Stomach/Bowel: Stomach is within normal limits. Appendix appears
normal. No evidence of bowel wall thickening, distention, or
inflammatory changes. Diverticulosis of descending and sigmoid colon
is noted without definite evidence of inflammation.

Vascular/Lymphatic: No significant vascular findings are present. No
enlarged abdominal or pelvic lymph nodes.

Reproductive: Intrauterine device is noted. No adnexal abnormality
is noted.

Other: Small fat containing periumbilical hernia is noted. No
ascites is noted.

Musculoskeletal: No acute or significant osseous findings.
IMPRESSION: Diverticulosis of descending and sigmoid colon is noted without
definite evidence of inflammation at this time.

Small fat containing periumbilical hernia.
